# Patient Record
Sex: Female | Born: 1980 | Race: White | Hispanic: No | Marital: Married | Smoking: Never smoker
Health system: Southern US, Community
[De-identification: ages and names within clinical notes are randomized; demographics above are authoritative.]

## PROBLEM LIST (undated history)

## (undated) DIAGNOSIS — Z801 Family history of malignant neoplasm of trachea, bronchus and lung: Secondary | ICD-10-CM

## (undated) DIAGNOSIS — Z8042 Family history of malignant neoplasm of prostate: Secondary | ICD-10-CM

## (undated) DIAGNOSIS — Z8489 Family history of other specified conditions: Secondary | ICD-10-CM

## (undated) DIAGNOSIS — Z803 Family history of malignant neoplasm of breast: Secondary | ICD-10-CM

## (undated) HISTORY — DX: Family history of malignant neoplasm of prostate: Z80.42

## (undated) HISTORY — DX: Family history of malignant neoplasm of breast: Z80.3

## (undated) HISTORY — DX: Family history of other specified conditions: Z84.89

## (undated) HISTORY — DX: Family history of malignant neoplasm of trachea, bronchus and lung: Z80.1

## (undated) HISTORY — PX: TONSILLECTOMY: SUR1361

---

## 2018-11-23 ENCOUNTER — Telehealth: Payer: Self-pay | Admitting: Genetics

## 2018-11-23 NOTE — Telephone Encounter (Signed)
Touched base with patient regarding genetics apt Monday 1/113/2020- asked if she had copies of genetic testing for her/herfamily.   She states her mother tested positive for [facility] mutation and has copies of her test result and family history she can bring in with her to her appointment.

## 2018-11-26 ENCOUNTER — Inpatient Hospital Stay: Payer: Commercial Managed Care - PPO | Attending: Genetic Counselor | Admitting: Genetics

## 2018-11-26 DIAGNOSIS — Z8489 Family history of other specified conditions: Secondary | ICD-10-CM

## 2018-11-26 DIAGNOSIS — Z801 Family history of malignant neoplasm of trachea, bronchus and lung: Secondary | ICD-10-CM

## 2018-11-26 DIAGNOSIS — Z803 Family history of malignant neoplasm of breast: Secondary | ICD-10-CM

## 2018-11-26 DIAGNOSIS — Z8042 Family history of malignant neoplasm of prostate: Secondary | ICD-10-CM

## 2018-11-27 ENCOUNTER — Encounter: Payer: Self-pay | Admitting: Genetics

## 2018-11-27 DIAGNOSIS — Z801 Family history of malignant neoplasm of trachea, bronchus and lung: Secondary | ICD-10-CM | POA: Insufficient documentation

## 2018-11-27 DIAGNOSIS — Z803 Family history of malignant neoplasm of breast: Secondary | ICD-10-CM | POA: Insufficient documentation

## 2018-11-27 DIAGNOSIS — Z8042 Family history of malignant neoplasm of prostate: Secondary | ICD-10-CM | POA: Insufficient documentation

## 2018-11-27 DIAGNOSIS — Z8489 Family history of other specified conditions: Secondary | ICD-10-CM | POA: Insufficient documentation

## 2018-11-27 NOTE — Progress Notes (Signed)
REFERRING PROVIDER: Self/Pearson  PRIMARY PROVIDER:  Garlan Fillers, MD  PRIMARY REASON FOR VISIT:  1. Family history of familial paraganglioma   2. Family history of breast cancer   3. Family history of lung cancer   4. Family history of prostate cancer     HISTORY OF PRESENT ILLNESS:   Taylor Pearson, a 38 y.o. female, was seen for a [facility] cancer genetics consultation via self referral due to a family history of Hereditary Pheochromocytoma and Paraganglioma ([place] mutation).  Taylor Pearson presents to clinic today to discuss the possibility of a hereditary predisposition to cancer, genetic testing, and to further clarify her future cancer risks, as well as potential cancer risks for family members.   Taylor Pearson is a 38 y.o. female with no personal history of cancer.    HORMONAL RISK FACTORS:  Menarche was at age 58.  First live birth at age 74.  Ovaries intact: yes.  Hysterectomy: no.  Menopausal status: premenopausal.  Colonoscopy: no; not examined. Mammogram within the last year: no.  Past Medical History:  Diagnosis Date  . Family history of breast cancer   . Family history of familial paraganglioma   . Family history of lung cancer   . Family history of prostate cancer     Social History   Socioeconomic History  . Marital status: Married    Spouse name: Not on file  . Number of children: Not on file  . Years of education: Not on file  . Highest education level: Not on file  Occupational History  . Not on file  Social Needs  . Financial resource strain: Not on file  . Food insecurity:    Worry: Not on file    Inability: Not on file  . Transportation needs:    Medical: Not on file    Non-medical: Not on file  Tobacco Use  . Smoking status: Not on file  Substance and Sexual Activity  . Alcohol use: Not on file  . Drug use: Not on file  . Sexual activity: Not on file  Lifestyle  . Physical activity:    Days per week: Not on file    Minutes per session:  Not on file  . Stress: Not on file  Relationships  . Social connections:    Talks on phone: Not on file    Gets together: Not on file    Attends religious service: Not on file    Active member of club or organization: Not on file    Attends meetings of clubs or organizations: Not on file    Relationship status: Not on file  Other Topics Concern  . Not on file  Social History Narrative  . Not on file     FAMILY HISTORY:  We obtained a detailed, 4-generation family history.  Significant diagnoses are listed below: Family History  Problem Relation Age of Onset  . Other Pearson        Positive for [place] mutation   . Breast cancer Maternal Aunt 69       Genetic testing negative- VUS  . Lung cancer Paternal Uncle   . Prostate cancer Maternal Grandfather 68       also had bile duct or lung cancer?  . Bladder Cancer Maternal Grandfather   . Other Paternal Grandmother        might have had breast cancer >50  . Breast cancer Maternal Aunt 50       Genetic testing negative- VUS  .  Other Maternal Aunt        SDHC+  . Other Maternal Aunt        SDHC+  . Other Maternal Aunt        tumor removed from neck  . Other Cousin        tumors removed from abdomen  . Other Other 61       carotid paraganglioma, SDHC+    Taylor Pearson has 2 sons ages 24 months and 47 years.  Taylor Pearson has 2 brothers with no hx of cancer.   Taylor Pearson father: 80, no hx of cancer.   Paternal Aunts/Uncles: 2 paternal uncles- 63 recently died of lung cancer.   Paternal cousins: 2 double cousins (paternal uncle had children with maternal aunt). 1 of these cousins had had tumors removed from her abdomen.  Paternal grandfather: died in his 78's with no hx of cancer.  Paternal grandmother:died in her 42's, she maybe had breast cancer dx >50.   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  Maternal Aunts/Uncles: 5 maternal aunts, 1  maternal uncle.  -1 uncle is deceased, no hx of cancer.  -2 maternal aunts have no hx of cancer, but have also tested positive for the familial SDHC mutation.  -2 maternal aunts with breast cancer ages 16 and 27)- they had genetic testing reportedly for [facility] and hereditary breast cancer that was negative- but both had a VUS identified (gene unk).   Maternal cousins: 1 set of cousins are double cousins- this is the cousin who has had tumors removed from her abdomen.  Maternal grandfather: died at 33- hx of prostate and bladder cancer at 76, died from bile duct or lung cancer at 29.  He had a niece with breast cancer in his 11's.  This grandfather had a maternal aunt who had colon cancer I her 78's- this relative's son also had colon cancer in her 32's.  Maternal grandmother:deceased, no hx of cancer. Her brother (patient's great uncle) had a carotid paraganglioma dx at 64- he was the original family member to test positive for the [facility] mutation.  This grandmother's paternal aunt had breast cancer. Dx >50.    Patient's maternal ancestors are of [place] descent, and paternal ancestors are of Russian/Italian descent. There is no reported Ashkenazi Jewish ancestry. There is no known consanguinity.  Hereditary Pheochromocytoma and Paraganglioma:  Single pathogenic variants in the [facility] gene are associated with the development of paragangliomas (PGL) and pheochromocytomas [facility]) as seen in hereditary paraganglioma-pheochromocytoma syndrome ( 82423536, 14431540, 08676195, 09326712), gastrointestinal stromal tumors (GIST) ( 45809983, 38250539, 76734193), Carney-Stratakis syndrome ( 79024097, 35329924, 26834196, 22297989, 21194174, 08144818), and renal cell carcinoma (PMID: 56314970, 26378588, 50277412, 87867672). It is unclear if pathogenic SDHC variants are associated with other cancers as the data are currently limited and emerging. The clinical presentation is highly variable among those with a pathogenic variant  in [facility] and may be difficult to predict. An individual with a [place] pathogenic variant will not necessarily develop cancer in their lifetime, but the risk for cancer is increased over that of the general population.  These changes can be inherited, or they could occur as a de novo (new) pathogenic change.  Paraganglioma/Pheochromocytoma Paragangliomas (PGL) are rare, adult-onset, typically benign neuroendocrine tumors that arise from paraganglia. Paraganglia are a collection of neuroendocrine tissues that are distributed throughout the body, from the middle ear and skull base to the pelvis. PGLs that develop in  the head and neck are called head-and-neck paragangliomas (HNP). Head-and-neck paragangliomas are the most common tumor type seen in carriers of SDHC mutations specifically.   PGLs can be located outside the head and neck. When they occur in the adrenal glands they are called pheochromocytomas [facility]). PCCs are catecholamine-secreting PGLs that are confined to the adrenal medulla, as defined by the The World Health Organization Tumor Classification; however, this term may also be used to refer to catecholamine-producing PGLs regardless of whether they are adrenal or extra-adrenal (PMID: 61950932). These lesions can cause excessive production of adrenal hormones, resulting in hypertension, headaches, tachycardia, anxiety, and sweaty or clammy skin in some individuals (PMID: 67124580, 99833825, 05397673). Most cases of PGL and [place] are sporadic, but approximately one-third are familial and due to an identifiable pathogenic variant in a susceptibility gene, such as [place], that can result in hereditary paraganglioma-pheochromocytoma syndrome (PMID: 41937902, 40973532, 99242683).  Individuals with SDHC pathogenic variants are at risk to develop solitary, benign, parasympathetic HNPs, although adrenal [facility] and extra-adrenal PGL have also been reported (PMID: 41962229). Literature suggests the mean age of tumor  presentation is approximately 26 years (PMID: 79892119, 41740814, 48185631, 49702637) and malignant transformation is rare (PMID: 85885027, 74128786, 76720947). It has been suggested that [facility] pathogenic variants may be associated with pituitary adenomas and pancreatic neuroendocrine tumors, however, the evidence is preliminary and emerging (PMID: 09628366, 29476546, 50354656, 81275170). Because SDHC pathogenic variants are rare, data on the clinical characteristics associated with this gene are currently limited (PMID: 01749449).  GIST Gastrointestinal stromal tumors (GIST) are rare, typically adult-onset sarcomas that may be either benign or malignant. They can occur sporadically or due to a heritable pathogenic variant in a susceptibility gene, such as [facility] (PMID: 67591638). GIST can develop anywhere along the GI tract, which includes the esophagus, stomach, gallbladder, liver, small intestine, colon, rectum, anus, and lining of the gut. GISTs arise from a specific cell type, interstitial cells of [place] (ICC), which line the walls of the GI tract. More than half of GISTs start in the stomach. The next-largest proportion of cases start in the small intestine, followed by the omentum and peritoneum (PMID: 46659935, 70177939, 03009233).  Carney-Stratakis syndrome Carney-Stratakis syndrome (CSS) is an autosomal dominant condition that can be caused by pathogenic variants in [facility]. This rare condition is characterized by the development of PGL, GIST, or both (PMID: 00762263, 33545625, 63893734, 28768115, 72620355, 97416384). CSS is very similar to but distinctly different from [place], a typically sporadic condition associated with pulmonary chondromas, GIST, and PGL (PMID: 53646803, 21224825).  Renal Cell Carcinoma:It has been reported that mutations in SDHx genes are associated with an increased risk for renal cell carcinoma- this risk is still being studies and has not been well defined at this time.    Gene information [place] is a tumor-suppressor gene that is involved in complex II of the mitochondrial electron transport chain (UniProtKB - [id number] (C560_HUMAN); [email or web address]. Accessed September 2015). The succinate dehydrogenase enzyme, which is part of complex II, is composed of four subunit proteins encoded by SDHA, SDHB, SDHC, and SDHD. These are nuclear genes whose transcripts are then imported into the mitochondria, where they are modified, folded, and assembled (PMID: 00370488). Lack of any component will result in instability of the entire complex (PMID: 89169450). If there is a pathogenic variant in this gene that prevents it from functioning normally, the risk of developing certain types of cancers may be increased.  Inheritance Pathogenic variants in the [place] gene are inherited in an autosomal  dominant manner. This means that an individual with a pathogenic variant has a 50% chance of passing the condition on to their offspring. With this result, it is now possible to identify at-risk relatives who can pursue testing for this specific familial variant.   Many cases are inherited from a parent, but some cases can occur spontaneously (i.e., an individual with a pathogenic variant has parents who do not have it).  It is important that all of Taylor Pearson's maternal relatives (both men and women) know of the presence of this gene mutation. Site-specific genetic testing can sort out who in the family is at risk and who is not.   Taylor Pearson siblings have a 50% chance to have inherited this mutation. Her children would have the same risk should she test positive. We recommend they have genetic testing for this same mutation, as identifying the presence of this mutation would allow them to also take advantage of risk-reducing measures. Genetic testing for [facility] is advised in minors because screening typically begins in childhood.   Management It is suggested that individuals  with hereditary paraganglioma-pheochromocytoma syndrome, along with their at-risk relatives, have regular clinical monitoring by a physician or medical team with expertise in the treatment of hereditary GIST and PGL/PCC syndromes. A consultation with an endocrine surgeon, endocrinologist, and otolaryngologist is also recommended to establish an individualized care plan (PMID: 24235361).  Although there is currently no clear consensus regarding a screening and surveillance protocol for individuals with pathogenic SDHC variants, lifelong annual biochemical and clinical surveillance is suggested (PMID: 44315400, 86761950, 93267124, 58099833):  A proposed screening protocol is outlined in PMID: 82505397    Screening should begin between ages 45-8.     Blood pressure and exam at every medical visit. At minimum every 6-12 months  24-hour urinary excretion of fractionated metanephrines and catecholamines and/or plasma fractionated metanephrines at least annually to detect metastatic disease, tumor recurrence, or development of additional tumors.  Follow up with imaging by CT, MRI, 123I-MIBG (metaiodobenzylguanidine) scintigraphy, or FDG-PET if the fractionated metanephrine and/or catecholamine levels become elevated, or if the original tumor had minimal or no catecholamine/fractionated metanephrine excess  Whole-body MRI (skull base to pelvis) every 2 years  Consider/optional neck MRI + contrast every 2 yeares  Annual Plasma methoxytyramihne, consider also annual serum chromogranin A  Annual complete blood cound (w/RBC indices) to screen for GIST    Other methods of surveillance suggested for consideration include:  Periodic (e.g., every 1-4 years) 123I-MIBG scintigraphy or PET to detect paragangliomas or metastatic disease that may be undetectable by MRI or CT  Imaging modalities should be at the discretion of the managing provider due to conflicting data regarding the utility and efficacy of  the various options (PMID: 67341937, 90240973)  Evaluation of cases with extra-adrenal sympathetic PGL and [place] for blood pressure elevations, tachycardia, and other signs and symptoms of catecholamine hypersecretion  Consideration of evaluation for GISTs in individuals (especially children, adolescents, or young adults) who have unexplained gastrointestinal symptoms (e.g., abdominal pain, upper gastrointestinal bleeding, nausea, vomiting, difficulty swallowing) or who experience unexplained intestinal obstruction or anemia  Symptom awareness/education   Consideration of RCC sceening (particularly if family history of RCC). (PMID: 53299242)   It is important to note that individuals with a pathogenic SDHC variant may possibly be at a greater risk of developing PGL or [place] when living in higher altitudes or when chronically exposed to hypoxic conditions. In general, inactivating mutations in one of the succinate dehydrogenase genes, which includes SDHC,  leads to accumulation of succinate, the formation of reactive oxygen species, and the activation of hypoxia-dependent pathways (PMID: 77824235). Avoidance of living at high altitudes and activities that promote long-term exposure to hypoxia and predispose to chronic lung disease (e.g., smoking) is therefore encouraged (PMID: 36144315, 40086761).  The natural history of hereditary paraganglioma-pheochromocytoma syndrome is variable and continues to evolve. This can often result in significant uncertainty regarding long-term prognosis. In addition, the clinical manifestations of PGLs and PCCs are broad; many symptoms can mimic minor ailments such as headaches and palpitations. Because of this reason, once a pathogenic variant has been identified, patients should be encouraged to have a low threshold for contacting their healthcare provider for further evaluation of unusual symptoms (PMID: (external) 24854530":http://www.ncbi.nlm.nih.gov/pubmed/24854530).   Awareness of this cancer predisposition can encourage patients and their providers to inform at-risk family members, to diligently follow condition-specific screening protocols, and to be vigilant in maintaining close and regular contact with their local genetics clinic in anticipation of new information.  DISCUSSION: We discussed the options of family variant testing vs. Panel testing.  Taylor Pearson Pearson only had single gene testing, and therefore was not screened for breast, colon, other cancer mutations.  Her aunts with breast cancer have had genetic testing that did not identify a mutation.    We discussed benefits and limitations to panel testing.   Taylor Pearson declined panel testing at this time and elected to proceed with Family Variant testing/single site testing for her Pearson's [facility]  Mutation.   Based on the patient's personal and family history, the statistical model (Tyrer Cusik)   Was used to estimate her risk of developing breast cancer. This estimates her lifetime risk of developing breast cancer to to be approximately 18.3%-22.9% the variability of this range is due to the uncertainty regarding if her PGM had breast cancer or not.  The patient's lifetime breast cancer risk is a preliminary estimate based on available information using one of several models endorsed by the [place] (ACS). The ACS recommends consideration of breast MRI screening as an adjunct to mammography for patients at high risk (defined as 20% or greater lifetime risk). A more detailed breast cancer risk assessment can be considered, if clinically indicated.     Taylor Pearson may be considered high risk if her PGM truly did have breast cancer. In this case she could consider high risk cancer screening.  We recommend that Taylor Pearson should discuss her individual situation with her OBGYN and determine a breast cancer screening plan with which they are both comfortable.    PLAN: After considering the  risks, benefits, and limitations, Taylor Pearson  provided informed consent to pursue genetic testing and the saliva sample was sent to [facility] for analysis of the Family Variant Testing: [place]. Results should be available within approximately 2-3 weeks' time, at which point they will be disclosed by telephone to Taylor Pearson, as will any additional recommendations warranted by these results. Taylor Pearson will receive a summary of her genetic counseling visit and a copy of her results once available. This information will also be available in Epic. We encouraged Taylor Pearson to remain in contact with cancer genetics annually so that we can continuously update the family history and inform her of any changes in cancer genetics and testing that may be of benefit for her family. Taylor Pearson questions were answered to her satisfaction today. Our contact information was provided should additional questions or concerns arise.  Based on Taylor Pearson's family  history, we recommended her maternal relatives all have genetic counseling and testing. Taylor Pearson will let us know if we can be of any assistance in coordinating genetic counseling and/or testing for this family member.   Lastly, we encouraged Ms. Claflin to remain in contact with cancer genetics annually so that we can continuously update the family history and inform her of any changes in cancer genetics and testing that may be of benefit for this family.   Ms.  Biskup questions were answered to her satisfaction today. Our contact information was provided should additional questions or concerns arise. Thank you for the referral and allowing [place] to share in the care of your patient.   Tana Felts, MS, [facility] Certified Genetic Counselor [email or web address] phone: [phone]  The patient was seen for a total of 40 minutes in face-to-face genetic counseling.  This patient was discussed with Drs. Magrinat, Lindi Adie and/or Burr Medico who agrees with the  above.

## 2018-12-11 ENCOUNTER — Telehealth: Payer: Self-pay | Admitting: Genetics

## 2018-12-11 NOTE — Telephone Encounter (Signed)
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    We discussed next steps for her and her family.   For [name], we recommended referral to endocrinology to discuss and implement a screening plan.  We suggested Dr. Buddy Duty who has seen several of our SDH- families in the past.  She was agreeable to this and we will coordinate this referral.   She declined a follow-up appointment with genetics- this was a known familial variant so the result was well covered during her pre-test counseling.   We discussed her tyrer cusik breast cancer risk estimate was slightly over 20% if her paternal grandmother did indeed have breast cancer, (just under if she did not)- the patient was unsure weather she had breast cancer or not.  We will send her Tyrer Cusick score to her OBGYN for follow-up discussion regarding breast screening.   We also recommended [name] inform her doctors if she is planning to have more children in the future, as they may want to do some extra screening before pregnancy to rule out pheochromocytoma/paraganglioma.   For her Family:  We recommended her siblings and all maternal relatives also have genetic testing for the [facility] mutation.  We discussed her children each have a 50% chance to also carry this mutation.  While it is unlikely to affect children, SDH-related tumors have been reported in children, therefore current screening protocol suggestions recommended screening start around the ages of 6-8 years. Therefore, it would be appropriate for her older son to be tested at this time (he is 44).  [name] was interested in getting genetic testing for her son.  We will have her fill out and sigh a release form and we can send her records to the [place] pediatric clinic who can test her son. (Our cancer center does not have pediatricians and cannot test children).   She will let us know if she has  any further questions or concerns.

## 2018-12-19 ENCOUNTER — Telehealth: Payer: Self-pay | Admitting: Genetics

## 2018-12-19 NOTE — Telephone Encounter (Signed)
Left message informing patient that I was able to contact [facility] to make referral for management of her [facility] mutation as requested (rather than needing to have primary care make referral).   They should be reaching out to her to set up appointment.  Left my contact info if she has further questions.

## 2018-12-27 ENCOUNTER — Ambulatory Visit: Payer: Self-pay | Admitting: Genetics

## 2018-12-27 ENCOUNTER — Encounter: Payer: Self-pay | Admitting: Genetics

## 2018-12-27 DIAGNOSIS — Q998 Other specified chromosome abnormalities: Secondary | ICD-10-CM | POA: Insufficient documentation

## 2018-12-27 DIAGNOSIS — Z8042 Family history of malignant neoplasm of prostate: Secondary | ICD-10-CM

## 2018-12-27 DIAGNOSIS — Z8489 Family history of other specified conditions: Secondary | ICD-10-CM

## 2018-12-27 DIAGNOSIS — Z1509 Genetic susceptibility to other malignant neoplasm: Secondary | ICD-10-CM

## 2018-12-27 DIAGNOSIS — Z801 Family history of malignant neoplasm of trachea, bronchus and lung: Secondary | ICD-10-CM

## 2018-12-27 DIAGNOSIS — Z803 Family history of malignant neoplasm of breast: Secondary | ICD-10-CM

## 2018-12-27 NOTE — Progress Notes (Signed)
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  Please refer to our prior cancer genetics clinic note for more information regarding Taylor Pearson's medical, social and family histories, and our assessment and recommendations, at the time. Taylor Pearson recent genetic test results were disclosed to her, as well as recommendations warranted by these results. These results and recommendations are discussed in more detail below.  CANCER HISTORY:  No personal history of cancer.     FAMILY HISTORY:  We obtained a detailed, 4-generation family history.  Significant diagnoses are listed below: Family History  Problem Relation Age of Onset  . Other Mother        Positive for [place] mutation   . Breast cancer Maternal Aunt 58       Genetic testing negative- VUS  . Lung cancer Paternal Uncle   . Prostate cancer Maternal Grandfather 60       also had bile duct or lung cancer?  . Bladder Cancer Maternal Grandfather   . Other Paternal Grandmother        might have had breast cancer >50  . Breast cancer Maternal Aunt 50       Genetic testing negative- VUS  . Other Maternal Aunt        SDHC+  . Other Maternal Aunt        SDHC+  . Other Maternal Aunt        tumor removed from neck  . Other Cousin        tumors removed from abdomen  . Other Other 61       carotid paraganglioma, SDHC+    Taylor Pearson has 2 sons ages 16 months and 46 years.  Taylor Pearson has 2 brothers with no hx of cancer.   Taylor Pearson father: 66, no hx of cancer.   Paternal Aunts/Uncles: 2 paternal uncles- 74 recently died of lung cancer.   Paternal cousins: 2 double cousins (paternal uncle had children with maternal aunt). 1 of these cousins had had tumors removed from her abdomen.  Paternal grandfather: died in his 26's with no hx of cancer.  Paternal grandmother:died in her 50's, she maybe had  breast cancer dx >50.   Taylor Pearson's mother: recently had genteic testing and was positive for a SDHC Likely Pathogneic mutation B.151+7O>H (splice donor).  Maternal Aunts/Uncles: 5 maternal aunts, 1 maternal uncle.  -1 uncle is deceased, no hx of cancer.  -2 maternal aunts have no hx of cancer, but have also tested positive for the familial SDHC mutation.  -2 maternal aunts with breast cancer ages 36 and 71)- they had genetic testing reportedly for [facility] and hereditary breast cancer that was negative- but both had a VUS identified (gene unk).   Maternal cousins: 1 set of cousins are double cousins- this is the cousin who has had tumors removed from her abdomen.  Maternal grandfather: died at 51- hx of prostate and bladder cancer at 19, died from bile duct or lung cancer at 19.  He had a niece with breast cancer in his 39's.  This grandfather had a maternal aunt who had colon cancer I her 34's- this relative's son also had colon cancer in her 87's.  Maternal grandmother:deceased, no hx of cancer. Her brother (patient's great uncle) had a carotid paraganglioma dx at 60- he was the original family member to test positive for the [facility] mutation.  This grandmother's paternal aunt had breast cancer. Dx >50.    Patient's maternal ancestors are of [place] descent, and paternal ancestors are of Russian/Italian descent. There is no reported Ashkenazi Jewish ancestry. There is no known consanguinity.  GENETIC TEST RESULTS: Genetic testing was performed through [facility] Family Variant Testing (single site testing) program and reported out on 12/06/2018.   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    The test report will be scanned into EPIC and will be located under the Molecular Pathology section of the Results Review tab. A portion of the result report is included below for reference.     We discussed with Taylor Pearson that because current genetic  testing is not perfect, it is possible there may be a gene mutation in one of these genes that current testing cannot detect, but that chance is small.  We also discussed, that there could be another gene that has not yet been discovered, or that we have not yet tested, that is responsible for the cancer diagnoses in the family. It is also possible there is a hereditary cause for the cancer in the family that Taylor Pearson did not inherit and therefore was not identified in her testing.  Therefore, it is important to remain in touch with cancer genetics in the future so that we can continue to offer Ms. Oguinn the most up to date genetic testing.   Hereditary Pheochromocytoma and Paraganglioma:  Single pathogenic variants in the [employer] gene are associated with the development of paragangliomas (PGL) and pheochromocytomas [facility]) as seen in hereditary paraganglioma-pheochromocytoma syndrome ( 99833825, 05397673, 41937902, 40973532), gastrointestinal stromal tumors (GIST) ( 99242683, 41962229, 79892119), Carney-Stratakis syndrome ( 41740814, 48185631, 49702637, 85885027, 74128786, 76720947), and renal cell carcinoma (PMID: 09628366, 29476546, 50354656, 81275170). It is unclear if pathogenic SDHC variants are associated with other cancers as the data are currently limited and emerging. The clinical presentation is highly variable among those with a pathogenic variant in [facility] and may be difficult to predict. An individual with a [place] pathogenic variant will not necessarily develop cancer in their lifetime, but the risk for cancer is increased over that of the general population.  These changes can be inherited, or they could occur as a de novo (new) pathogenic change.  Paraganglioma/Pheochromocytoma Paragangliomas (PGL) are rare, adult-onset, typically benign neuroendocrine tumors that arise from paraganglia. Paraganglia are a collection of neuroendocrine tissues that are distributed throughout the body, from the middle  ear and skull base to the pelvis. PGLs that develop in the head and neck are called head-and-neck paragangliomas (HNP). Head-and-neck paragangliomas are the most common tumor type seen in carriers of SDHC mutations specifically.   PGLs can be located outside the head and neck. When they occur in the adrenal glands they are called pheochromocytomas [facility]). PCCs are catecholamine-secreting PGLs that are confined to the adrenal medulla, as defined by the The World Health Organization Tumor Classification; however, this term may also be used to refer to catecholamine-producing PGLs regardless of whether they are adrenal or extra-adrenal (PMID: 01749449). These lesions can cause excessive production of adrenal hormones, resulting in hypertension, headaches, tachycardia, anxiety, and sweaty or clammy skin in some individuals (PMID: 67591638, 46659935, 70177939). Most cases of PGL and [place] are sporadic, but approximately one-third are familial and due to an identifiable pathogenic variant in a susceptibility gene, such as [place], that can result in hereditary paraganglioma-pheochromocytoma syndrome (PMID: 03009233, 00762263, 33545625).  Individuals with SDHC pathogenic variants are at  risk to develop solitary, benign, parasympathetic HNPs, although adrenal [facility] and extra-adrenal PGL have also been reported (PMID: 28413244). Literature suggests the mean age of tumor presentation is approximately 3 years (PMID: 01027253, 66440347, 42595638, 75643329) and malignant transformation is rare (PMID: 51884166, 06301601, 09323557). It has been suggested that [facility] pathogenic variants may be associated with pituitary adenomas and pancreatic neuroendocrine tumors, however, the evidence is preliminary and emerging (PMID: 32202542, 70623762, 83151761, 60737106). Because SDHC pathogenic variants are rare, data on the clinical characteristics associated with this gene are currently limited (PMID: 26948546).  GIST Gastrointestinal  stromal tumors (GIST) are rare, typically adult-onset sarcomas that may be either benign or malignant. They can occur sporadically or due to a heritable pathogenic variant in a susceptibility gene, such as [facility] (PMID: 27035009). GIST can develop anywhere along the GI tract, which includes the esophagus, stomach, gallbladder, liver, small intestine, colon, rectum, anus, and lining of the gut. GISTs arise from a specific cell type, interstitial cells of [place] (ICC), which line the walls of the GI tract. More than half of GISTs start in the stomach. The next-largest proportion of cases start in the small intestine, followed by the omentum and peritoneum (PMID: 38182993, 71696789, 38101751).  Carney-Stratakis syndrome Carney-Stratakis syndrome (CSS) is an autosomal dominant condition that can be caused by pathogenic variants in [facility]. This rare condition is characterized by the development of PGL, GIST, or both (PMID: 02585277, 82423536, 14431540, 08676195, 09326712, 45809983). CSS is very similar to but distinctly different from [place] triad, a typically sporadic condition associated with pulmonary chondromas, GIST, and PGL (PMID: 38250539, 76734193).  Renal Cell Carcinoma:It has been reported that mutations in SDHx genes are associated with an increased risk for renal cell carcinoma- this risk is still being studies and has not been well defined at this time.   Gene information [place] is a tumor-suppressor gene that is involved in complex II of the mitochondrial electron transport chain (UniProtKB - [id number] (C560_HUMAN); [email or web address]. Accessed September 2015). The succinate dehydrogenase enzyme, which is part of complex II, is composed of four subunit proteins encoded by SDHA, SDHB, SDHC, and SDHD. These are nuclear genes whose transcripts are then imported into the mitochondria, where they are modified, folded, and assembled (PMID: 79024097). Lack of any component will result in  instability of the entire complex (PMID: 35329924). If there is a pathogenic variant in this gene that prevents it from functioning normally, the risk of developing certain types of cancers may be increased.  Inheritance Pathogenic variants in the [facility] gene are inherited in an autosomal dominant manner. This means that an individual with a pathogenic variant has a 50% chance of passing the condition on to their offspring. With this result, it is now possible to identify at-risk relatives who can pursue testing for this specific familial variant.   Many cases are inherited from a parent, but some cases can occur spontaneously (i.e., an individual with a pathogenic variant has parents who do not have it).  It is important that all of Taylor Pearson's maternal relatives (both men and women) know of the presence of this gene mutation. Site-specific genetic testing can sort out who in the family is at risk and who is not.   Taylor Pearson children and siblings have a 50% chance to have inherited this mutation. We recommend they have genetic testing for this same mutation, as identifying the presence of this mutation would allow them to also take advantage of risk-reducing measures. Genetic testing for [facility] is advised in minors  because screening typically begins in childhood.   Management It is suggested that individuals with hereditary paraganglioma-pheochromocytoma syndrome, along with their at-risk relatives, have regular clinical monitoring by a physician or medical team with expertise in the treatment of hereditary GIST and PGL/PCC syndromes. A consultation with an endocrine surgeon, endocrinologist, and otolaryngologist is also recommended to establish an individualized care plan (PMID: 71245809).  Although there is currently no clear consensus regarding a screening and surveillance protocol for individuals with pathogenic SDHC variants, lifelong annual biochemical and clinical surveillance is suggested  (PMID: 98338250, 53976734, 19379024, 09735329):  A proposed screening protocol is outlined in PMID: 92426834    Screening should begin between ages 66-8.     Blood pressure and exam at every medical visit. At minimum every 6-12 months  24-hour urinary excretion of fractionated metanephrines and catecholamines and/or plasma fractionated metanephrines at least annually to detect metastatic disease, tumor recurrence, or development of additional tumors. ? Follow up with imaging by CT, MRI, 123I-MIBG (metaiodobenzylguanidine) scintigraphy, or FDG-PET if the fractionated metanephrine and/or catecholamine levels become elevated, or if the original tumor had minimal or no catecholamine/fractionated metanephrine excess  Whole-body MRI (skull base to pelvis) every 2 years  Consider/optional neck MRI + contrast every 2 yeares  Annual Plasma methoxytyramihne, consider also annual serum chromogranin A  Annual complete blood cound (w/RBC indices) to screen for GIST  Other methods of surveillance suggested for consideration include:  Periodic (e.g., every 1-4 years) 123I-MIBG scintigraphy or PET to detect paragangliomas or metastatic disease that may be undetectable by MRI or CT ? Imaging modalities should be at the discretion of the managing provider due to conflicting data regarding the utility and efficacy of the various options (PMID: 19622297, 98921194)  Evaluation of cases with extra-adrenal sympathetic PGL and [place] for blood pressure elevations, tachycardia, and other signs and symptoms of catecholamine hypersecretion  Consideration of evaluation for GISTs in individuals (especially children, adolescents, or young adults) who have unexplained gastrointestinal symptoms (e.g., abdominal pain, upper gastrointestinal bleeding, nausea, vomiting, difficulty swallowing) or who experience unexplained intestinal obstruction or anemia  Symptom awareness/education   Consideration of RCC sceening  (particularly if family history of RCC). (PMID: 17408144)   It is important to note that individuals with a pathogenic SDHC variant may possibly be at a greater risk of developing PGL or [place] when living in higher altitudes or when chronically exposed to hypoxic conditions. In general, inactivating mutations in one of the succinate dehydrogenase genes, which includes SDHC, leads to accumulation of succinate, the formation of reactive oxygen species, and the activation of hypoxia-dependent pathways (PMID: 81856314). Avoidance of living at high altitudes and activities that promote long-term exposure to hypoxia and predispose to chronic lung disease (e.g., smoking) is therefore encouraged (PMID: 97026378, 58850277).  The natural history of hereditary paraganglioma-pheochromocytoma syndrome is variable and continues to evolve. This can often result in significant uncertainty regarding long-term prognosis. In addition, the clinical manifestations of PGLs and PCCs are broad; many symptoms can mimic minor ailments such as headaches and palpitations. Because of this reason, once a pathogenic variant has been identified, patients should be encouraged to have a low threshold for contacting their healthcare provider for further evaluation of unusual symptoms (PMID: (external) 24854530":http://www.ncbi.nlm.nih.gov/pubmed/24854530).  Awareness of this cancer predisposition can encourage patients and their providers to inform at-risk family members, to diligently follow condition-specific screening protocols, and to be vigilant in maintaining close and regular contact with their local genetics clinic in anticipation of new information.  Taylor Pearson declined  panel testing at this time and elected to proceed with Family Variant testing/single site testing for her mother's [facility]  Mutation.   Based on the patient's personal and family history, the statistical model (Tyrer Cusik)   Was used to estimate her risk of  developing breast cancer. This estimates her lifetime risk of developing breast cancer to to be approximately 18.3%-22.9% the variability of this range is due to the uncertainty regarding if her PGM had breast cancer or not.  The patient's lifetime breast cancer risk is a preliminary estimate based on available information using one of several models endorsed by the [place] (ACS). The ACS recommends consideration of breast MRI screening as an adjunct to mammography for patients at high risk (defined as 20% or greater lifetime risk). A more detailed breast cancer risk assessment can be considered, if clinically indicated.     Taylor Pearson    PLAN:   1. We have recommended Taylor Pearson establish care with an endocrinologist to establish and follow a screening plan.    Taylor Pearson is agreeable and has requested we make the referral if possible.  We have contacted and faxed her information and referral to [facility] endocrinology. They should be reaching out to schedule this appointment.   2. We have faxed her results to [place] where they are able to test minors and requested they contact Taylor Pearson to schedule a appointment for her son to be tested.   3. Taylor Pearson did not have any other questions and felt comfortable with this result after our pretest counseling session and phone discussion she declined a follow-up appointment with genetic counseling at this time.   Lastly, we discussed with Taylor Pearson that cancer genetics is a rapidly advancing field and it is possible that new genetic tests will be appropriate for her and/or her family members in the future. We encouraged her to remain in contact with cancer genetics on an annual basis so we can update her personal and family histories and let her know of advances in cancer genetics that may benefit this family.   Our contact number was provided. Ms. Irigoyen questions were answered to her satisfaction, and  she knows she is welcome to call us at anytime with additional questions or concerns.   Ferol Luz, MS, [facility] Certified Genetic Counselor [email or web address]

## 2019-01-22 ENCOUNTER — Encounter: Payer: Self-pay | Admitting: Internal Medicine

## 2019-01-22 ENCOUNTER — Ambulatory Visit (INDEPENDENT_AMBULATORY_CARE_PROVIDER_SITE_OTHER): Payer: Commercial Managed Care - PPO | Admitting: Internal Medicine

## 2019-01-22 VITALS — BP 90/60 | HR 60 | Ht 68.0 in | Wt 157.0 lb

## 2019-01-22 DIAGNOSIS — D447 Neoplasm of uncertain behavior of aortic body and other paraganglia: Secondary | ICD-10-CM

## 2019-01-22 LAB — CBC WITH DIFFERENTIAL/PLATELET
Basophils Absolute: 0.1 10*3/uL (ref 0.0–0.1)
Basophils Relative: 0.8 % (ref 0.0–3.0)
Eosinophils Absolute: 0.2 10*3/uL (ref 0.0–0.7)
Eosinophils Relative: 2.2 % (ref 0.0–5.0)
HCT: 40.2 % (ref 36.0–46.0)
Hemoglobin: 13.8 g/dL (ref 12.0–15.0)
Lymphocytes Relative: 38.8 % (ref 12.0–46.0)
Lymphs Abs: 3.2 10*3/uL (ref 0.7–4.0)
MCHC: 34.3 g/dL (ref 30.0–36.0)
MCV: 84.6 fl (ref 78.0–100.0)
Monocytes Absolute: 0.7 10*3/uL (ref 0.1–1.0)
Monocytes Relative: 8 % (ref 3.0–12.0)
Neutro Abs: 4.2 10*3/uL (ref 1.4–7.7)
Neutrophils Relative %: 50.2 % (ref 43.0–77.0)
Platelets: 230 10*3/uL (ref 150.0–400.0)
RBC: 4.75 Mil/uL (ref 3.87–5.11)
RDW: 12.9 % (ref 11.5–15.5)
WBC: 8.3 10*3/uL (ref 4.0–10.5)

## 2019-01-22 NOTE — Progress Notes (Signed)
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 is a 38 y.o. female with unremarkable past medical history . The patient presented for initial endocrinology clinic visit on 01/23/2019 for consultative assistance with her [facility] mutation.   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  She is here for further work up paragangliomas an pheochromocytoma.   Headaches: rare HTN:no Palpitations: no Anxiety: situational Sweaty, Clammy sensations:  No  She also denies any abdominal pain, nausea,, or any change in bowel movements, she does have chronic constipation. She has had fissures in her 99's.  No scope done at the time. 2 brothers  Has 2 sons 2 and 6 yrs.   School administrator   HISTORY:  Past Medical History:  Past Medical History:  Diagnosis Date  . Family history of breast cancer   . Family history of familial paraganglioma   . Family history of lung cancer   . Family history of prostate cancer    Past Surgical History:  Past Surgical History:  Procedure Laterality Date  . TONSILLECTOMY        Social History:    Family History: family history includes Bladder Cancer in her maternal grandfather; Breast cancer (age of onset: 59) in her maternal aunt; Breast cancer (age of onset: 67) in her maternal aunt; Lung cancer in her paternal uncle; Other in her cousin, maternal aunt, maternal aunt, maternal aunt, mother, and paternal grandmother; Other (age of onset: 10) in an other family member; Prostate cancer (age of onset: 65) in her maternal grandfather.   HOME MEDICATIONS: Allergies as of 01/22/2019      Reactions   Augmentin [amoxicillin-pot Clavulanate]    Rash       Medication List    as of January 22, 2019 11:59 PM   You have not been prescribed any medications.       REVIEW OF SYSTEMS: A comprehensive ROS was conducted with the patient and is negative except as per HPI and below:  Review of Systems  Constitutional: Negative for fever and weight loss.  HENT: Negative for congestion and sore throat.   Eyes: Negative for blurred vision and discharge.  Respiratory: Negative for cough and shortness of breath.   Cardiovascular: Negative for chest pain and palpitations.  Gastrointestinal: Negative for abdominal pain and nausea.  Genitourinary: Negative for frequency.  Neurological: Negative for tingling, tremors and headaches.  Endo/Heme/Allergies: Negative for polydipsia.  Psychiatric/Behavioral: Negative for depression. The patient is not nervous/anxious.        OBJECTIVE:  VS: BP 90/60   Pulse 60   Ht 5\' 8"  (1.727 m)   Wt 157 lb (71.2 kg)   BMI 23.87 kg/m    Wt Readings from Last 3 Encounters:  01/22/19 157 lb (71.2 kg)     EXAM: General: Pt appears well and is in NAD  Hydration: Well-hydrated with moist mucous membranes and good skin turgor  Eyes: External eye exam normal without stare, lid lag or exophthalmos.  EOM intact.  PERRL.  Ears, Nose, Throat: Hearing: Grossly intact bilaterally Dental: Good dentition  Throat: Clear without mass, erythema or exudate  Neck: General: Supple without adenopathy.  Thyroid: Thyroid size normal.  No goiter or nodules appreciated. No thyroid bruit.  Lungs: Clear with good BS bilat with no rales, rhonchi, or wheezes  Heart: Auscultation: RRR.  Abdomen: Normoactive bowel sounds, soft, nontender, without masses or organomegaly palpable  Extremities: Gait and station: Normal gait  Digits and nails: No clubbing, cyanosis, petechiae, or nodes Head and neck: Normal alignment and mobility BL UE: Normal ROM and strength. BL LE: No pretibial edema normal ROM and strength.  Skin: Hair:  Texture and amount normal with gender appropriate distribution Skin Inspection: No rashes, acanthosis nigricans/skin tags. No lipohypertrophy Skin Palpation: Skin temperature, texture, and thickness normal to palpation  Neuro: Cranial nerves: II - XII grossly intact  Cerebellar: Normal coordination and movement; no tremor Motor: Normal strength throughout DTRs: 2+ and symmetric in UE without delay in relaxation phase  Mental Status: Judgment, insight: Intact Orientation: Oriented to time, place, and person Memory: Intact for recent and remote events Mood and affect: No depression, anxiety, or agitation     DATA REVIEWED:   Results for Taylor Pearson (MRN [id number]) as of 01/23/2019 07:42  Ref. Range 01/22/2019 15:03  WBC Latest Ref Range: 4.0 - 10.5 K/uL 8.3  RBC Latest Ref Range: 3.87 - 5.11 Mil/uL 4.75  Hemoglobin Latest Ref Range: 12.0 - 15.0 g/dL 13.8  HCT Latest Ref Range: 36.0 - 46.0 % 40.2  MCV Latest Ref Range: 78.0 - 100.0 fl 84.6  MCHC Latest Ref Range: 30.0 - 36.0 g/dL 34.3  RDW Latest Ref Range: 11.5 - 15.5 % 12.9  Platelets Latest Ref Range: 150.0 - 400.0 K/uL 230.0  Neutrophils Latest Ref Range: 43.0 - 77.0 % 50.2  Lymphocytes Latest Ref Range: 12.0 - 46.0 % 38.8  Monocytes Relative Latest Ref Range: 3.0 - 12.0 % 8.0  Eosinophil Latest Ref Range: 0.0 - 5.0 % 2.2  Basophil Latest Ref Range: 0.0 - 3.0 % 0.8  NEUT# Latest Ref Range: 1.4 - 7.7 K/uL 4.2  Lymphocyte # Latest Ref Range: 0.7 - 4.0 K/uL 3.2  Monocyte # Latest Ref Range: 0.1 - 1.0 K/uL 0.7  Eosinophils Absolute Latest Ref Range: 0.0 - 0.7 K/uL 0.2  Basophils Absolute Latest Ref Range: 0.0 - 0.1 K/uL 0.1   Plasma metanephrines-pending   Old records , labs and images have been reviewed.   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 -We have discussed that we will obtain plasma fractionated metanephrines, this will need to be done manually. -We will order whole-body MRI to include the skull base to the pelvis, and this will be done every 2 years. -We will obtain CBC with differential, to screen for GIST -We will consider 123 I-MIBG scintigraphy or an FDG-PET at some point as well, and this may be done every 5 years.   Follow-up 1 year  25 minutes were spent with the patient, over 50% of the time was spent in counseling and education.  Signed electronically by: Mack Guise, MD  [facility] Endocrinology  [facility] [address] [place], [place] [address] Phone: [phone] FAX: [phone]   CC: Taylor Pearson, [place] [address] Phone: [phone] Fax: [phone]   Return to Endocrinology clinic as below: Future Appointments  Date Time Provider [place]  01/22/2020  3:40 PM Burr Soffer, Melanie Crazier, MD [facility] None

## 2019-01-22 NOTE — Patient Instructions (Signed)
-   Please stop by the lab today, we will contact you with the results.  - We also ordered MRI , if you do not hear from anyone about this appointment please contact our office.

## 2019-01-24 ENCOUNTER — Encounter: Payer: Self-pay | Admitting: Internal Medicine

## 2019-01-25 ENCOUNTER — Encounter: Payer: Self-pay | Admitting: Internal Medicine

## 2019-01-25 LAB — METANEPHRINES, PLASMA
Metanephrine, Free: 35 pg/mL (ref <=57)
Normetanephrine, Free: 97 pg/mL (ref <=148)
Total Metanephrines-Plasma: 132 pg/mL (ref <=205)

## 2020-01-20 ENCOUNTER — Other Ambulatory Visit: Payer: Self-pay

## 2020-01-22 ENCOUNTER — Other Ambulatory Visit: Payer: Self-pay

## 2020-01-22 ENCOUNTER — Encounter: Payer: Self-pay | Admitting: Internal Medicine

## 2020-01-22 ENCOUNTER — Ambulatory Visit (INDEPENDENT_AMBULATORY_CARE_PROVIDER_SITE_OTHER): Payer: Commercial Managed Care - PPO | Admitting: Internal Medicine

## 2020-01-22 VITALS — BP 98/62 | HR 90 | Temp 98.4°F | Ht 68.0 in | Wt 155.2 lb

## 2020-01-22 DIAGNOSIS — D447 Neoplasm of uncertain behavior of aortic body and other paraganglia: Secondary | ICD-10-CM

## 2020-01-22 NOTE — Patient Instructions (Signed)

## 2020-01-22 NOTE — Progress Notes (Signed)
Name: Taylor Pearson  MRN/ DOB: [id number], 1981/02/01    Age/ Sex: 39 y.o., female     PCP: Taylor Fillers, MD   Reason for Endocrinology Evaluation: Taylor Pearson mutation     Initial Endocrinology Clinic Visit: 01/23/2019    PATIENT IDENTIFIER: Taylor Pearson is a 39 y.o., female with unremarkable past medical history. She has followed with [place] [facility] since 01/23/2019 for consultative assistance with management of her [facility] mutation.  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     Kids 3 and 62 yrs old  [place] maternal uncle with paraganglioma  SUBJECTIVE:   During last visit (01/23/2019): Negative biochemical testing   Today (01/23/2020):  Taylor Pearson is here for [facility] Pearson mutation.  Headaches: no HTN: no Palpitations: no Anxiety: no  Sweaty, Clammy sensations:  no  Abdominal Pain: no    ROS:  As per HPI.   HISTORY:  Past Medical History:  Past Medical History:  Diagnosis Date  . Family history of breast cancer   . Family history of familial paraganglioma   . Family history of lung cancer   . Family history of prostate cancer    Past Surgical History:  Past Surgical History:  Procedure Laterality Date  . TONSILLECTOMY      Social History:  reports that she has never smoked. She has never used smokeless tobacco. She reports previous alcohol use. She reports that she does not use drugs. Family History:  Family History  Problem Relation Age of Onset  . Other Mother        Positive for [place] mutation   . Breast cancer Maternal Aunt 1       Genetic testing negative- VUS  . Lung cancer Paternal Uncle   . Prostate cancer Maternal Grandfather 53       also had bile duct or lung cancer?  . Bladder Cancer Maternal Grandfather   . Other Paternal Grandmother        might have had breast cancer >50  . Breast cancer Maternal Aunt 50        Genetic testing negative- VUS  . Other Maternal Aunt        SDHC+  . Other Maternal Aunt        SDHC+  . Other Maternal Aunt        tumor removed from neck  . Other Cousin        tumors removed from abdomen  . Other Other 61       carotid paraganglioma, [facility]     HOME MEDICATIONS: Allergies as of 01/22/2020      Reactions   Augmentin [amoxicillin-pot Clavulanate]    Rash       Medication List       Accurate as of January 22, 2020 11:59 PM. If you have any questions, ask your nurse or doctor.        multivitamin capsule Take by mouth.         OBJECTIVE:   PHYSICAL EXAM: VS: BP 98/62 (BP Location: Right Arm, Patient Position: Sitting, Cuff Size: Large)   Pulse 90   Temp 98.4 F (36.9 C)   Ht 5\' 8"  (1.727 m)   Wt 155 lb 3.2 oz (70.4 kg)   LMP 12/16/2019 (Approximate)   SpO2 99%   BMI 23.60 kg/m    EXAM: General: Pt appears well and is in NAD  Neck:  General: Supple without adenopathy. Thyroid: Thyroid size normal.  No goiter or nodules appreciated. No thyroid bruit.  Lungs: Clear with good BS bilat with no rales, rhonchi, or wheezes  Heart: Auscultation: RRR.  Abdomen: Normoactive bowel sounds, soft, nontender, without masses or organomegaly palpable  Extremities:  BL LE: No pretibial edema normal ROM and strength.  Skin: Hair: Texture and amount normal with gender appropriate distribution Skin Inspection: No rashes Skin Palpation: Skin temperature, texture, and thickness normal to palpation  Mental Status: Judgment, insight: Intact Orientation: Oriented to time, place, and person Mood and affect: No depression, anxiety, or agitation     DATA REVIEWED: Results for Taylor Pearson (MRN [id number]) as of 01/23/2020 20:52  Ref. Range 01/22/2020 16:32  Sodium Latest Ref Range: 135 - 145 mEq/L 136  Potassium Latest Ref Range: 3.5 - 5.1 mEq/L 3.8  Chloride Latest Ref Range: 96 - 112 mEq/L 104  CO2 Latest Ref Range: 19 - 32 mEq/L 27  Glucose Latest Ref  Range: 70 - 99 mg/dL 103 (H)  BUN Latest Ref Range: 6 - 23 mg/dL 12  Creatinine Latest Ref Range: 0.40 - 1.20 mg/dL 0.66  Calcium Latest Ref Range: 8.4 - 10.5 mg/dL 9.1  Alkaline Phosphatase Latest Ref Range: 39 - 117 U/L 42  Albumin Latest Ref Range: 3.5 - 5.2 g/dL 4.2  AST Latest Ref Range: 0 - 37 U/L 24  ALT Latest Ref Range: 0 - 35 U/L 26  Total Protein Latest Ref Range: 6.0 - 8.3 g/dL 7.2  Total Bilirubin Latest Ref Range: 0.2 - 1.2 mg/dL 0.4  GFR Latest Ref Range: >60.00 mL/min 100.22      ASSESSMENT / PLAN / RECOMMENDATIONS:   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 - SDHC mutations is an autosomal dominant with variable penetrance -There is no evidence of clinical catecholamine excess at this time, we will proceed with 24-hour urine collection of catecholamines and metanephrines. -I have ordered a whole-body MRI last year but due to Covid-19 she was uncomfortable proceeding with this at the time, patient in agreement to proceed with MRI imaging, these will be considered every 2 years.  We did discuss that in hereditary paragangliomas, these tumors tend to grow prior to becoming excretary and it may be beneficial for the patient to diagnose these early on.  Patient expressed understanding -We will consider 123 I-MIBG scintigraphy or an FDG-PET at some next year, and this may be done every 5 years     Signed electronically by: Taylor Guise, MD  [facility] Endocrinology  [address] [place], [place] [address] Phone: [phone] FAX: [phone]      CC: Taylor Pearson, [place] [address] Phone: [phone]  Fax: [phone]   Return to Endocrinology clinic as below: Future Appointments  Date Time Provider [place]  01/27/2021  3:40 PM Reyce Lubeck, Melanie Crazier, MD  [facility] None

## 2020-01-23 ENCOUNTER — Encounter: Payer: Self-pay | Admitting: Internal Medicine

## 2020-01-23 LAB — COMPREHENSIVE METABOLIC PANEL
ALT: 26 U/L (ref 0–35)
AST: 24 U/L (ref 0–37)
Albumin: 4.2 g/dL (ref 3.5–5.2)
Alkaline Phosphatase: 42 U/L (ref 39–117)
BUN: 12 mg/dL (ref 6–23)
CO2: 27 mEq/L (ref 19–32)
Calcium: 9.1 mg/dL (ref 8.4–10.5)
Chloride: 104 mEq/L (ref 96–112)
Creatinine, Ser: 0.66 mg/dL (ref 0.40–1.20)
GFR: 100.22 mL/min (ref >60.00)
Glucose, Bld: 103 mg/dL — ABNORMAL HIGH (ref 70–99)
Potassium: 3.8 mEq/L (ref 3.5–5.1)
Sodium: 136 mEq/L (ref 135–145)
Total Bilirubin: 0.4 mg/dL (ref 0.2–1.2)
Total Protein: 7.2 g/dL (ref 6.0–8.3)

## 2020-02-12 LAB — METANEPHRINES, URINE, 24 HOUR
Metaneph Total, Ur: 74 ug/L
Metanephrines, 24H Ur: 111 ug/24 hr (ref 36–209)
Normetanephrine, 24H Ur: 261 ug/24 hr (ref 131–612)
Normetanephrine, Ur: 174 ug/L

## 2020-02-12 LAB — CATECHOLAMINES, FRACTIONATED, URINE, 24 HOUR
Dopamine , 24H Ur: 266 ug/24 hr (ref 0–510)
Dopamine, Rand Ur: 177 ug/L
Epinephrine, 24H Ur: 3 ug/24 hr (ref 0–20)
Epinephrine, Rand Ur: 2 ug/L
Norepinephrine, 24H Ur: 32 ug/24 hr (ref 0–135)
Norepinephrine, Rand Ur: 21 ug/L

## 2020-02-17 ENCOUNTER — Encounter: Payer: Self-pay | Admitting: Internal Medicine

## 2021-01-27 ENCOUNTER — Ambulatory Visit: Payer: Commercial Managed Care - PPO | Admitting: Internal Medicine

## 2021-02-24 ENCOUNTER — Ambulatory Visit (INDEPENDENT_AMBULATORY_CARE_PROVIDER_SITE_OTHER): Payer: Commercial Managed Care - PPO | Admitting: Internal Medicine

## 2021-02-24 ENCOUNTER — Other Ambulatory Visit: Payer: Self-pay

## 2021-02-24 ENCOUNTER — Encounter: Payer: Self-pay | Admitting: Internal Medicine

## 2021-02-24 VITALS — BP 102/72 | HR 79 | Ht 68.0 in | Wt 160.2 lb

## 2021-02-24 DIAGNOSIS — R1900 Intra-abdominal and pelvic swelling, mass and lump, unspecified site: Secondary | ICD-10-CM

## 2021-02-24 DIAGNOSIS — D447 Neoplasm of uncertain behavior of aortic body and other paraganglia: Secondary | ICD-10-CM

## 2021-02-24 LAB — CBC
HCT: 42.4 % (ref 36.0–46.0)
Hemoglobin: 14.4 g/dL (ref 12.0–15.0)
MCHC: 33.9 g/dL (ref 30.0–36.0)
MCV: 85.7 fl (ref 78.0–100.0)
Platelets: 252 10*3/uL (ref 150.0–400.0)
RBC: 4.95 Mil/uL (ref 3.87–5.11)
RDW: 12.9 % (ref 11.5–15.5)
WBC: 7.3 10*3/uL (ref 4.0–10.5)

## 2021-02-24 LAB — COMPREHENSIVE METABOLIC PANEL
ALT: 14 U/L (ref 0–35)
AST: 17 U/L (ref 0–37)
Albumin: 4.2 g/dL (ref 3.5–5.2)
Alkaline Phosphatase: 42 U/L (ref 39–117)
BUN: 14 mg/dL (ref 6–23)
CO2: 28 mEq/L (ref 19–32)
Calcium: 9.4 mg/dL (ref 8.4–10.5)
Chloride: 104 mEq/L (ref 96–112)
Creatinine, Ser: 0.68 mg/dL (ref 0.40–1.20)
GFR: 109.96 mL/min (ref >60.00)
Glucose, Bld: 101 mg/dL — ABNORMAL HIGH (ref 70–99)
Potassium: 4 mEq/L (ref 3.5–5.1)
Sodium: 138 mEq/L (ref 135–145)
Total Bilirubin: 0.4 mg/dL (ref 0.2–1.2)
Total Protein: 7.3 g/dL (ref 6.0–8.3)

## 2021-02-24 NOTE — Progress Notes (Signed)
Name: Taylor Pearson  MRN/ DOB: [id number], July 04, 1981    Age/ Sex: 40 y.o., female     PCP: Garlan Fillers, MD   Reason for Endocrinology Evaluation: [facility] mutation     Initial Endocrinology Clinic Visit: 01/23/2019    PATIENT IDENTIFIER: [name] is a 40 y.o., female with unremarkable past medical history. She has followed with [place] [facility] since 01/23/2019 for consultative assistance with management of her [facility] mutation.  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   I have ordered imaging in 2020 but she has not done them due to variable reasons, she understands the importance of imaging as paragangliomas can go prior to becoming excreter he.  Kids 47 and 50 yrs old  [place] maternal uncle with paraganglioma  SUBJECTIVE:    Today (02/24/2021):  [name] is here for [facility] mutation.   Headaches: no HTN: no Palpitations: no Anxiety: no  Sweaty, Clammy sensations:  no  Abdominal Pain: no     HISTORY:  Past Medical History:  Past Medical History:  Diagnosis Date  . Family history of breast cancer   . Family history of familial paraganglioma   . Family history of lung cancer   . Family history of prostate cancer    Past Surgical History:  Past Surgical History:  Procedure Laterality Date  . TONSILLECTOMY      Social History:  reports that she has never smoked. She has never used smokeless tobacco. She reports previous alcohol use. She reports that she does not use drugs. Family History:  Family History  Problem Relation Age of Onset  . Other Mother        Positive for [place] mutation   . Breast cancer Maternal Aunt 31       Genetic testing negative- VUS  . Lung cancer Paternal Uncle   . Prostate cancer Maternal Grandfather 64       also had bile duct or lung cancer?  . Bladder Cancer Maternal Grandfather   . Other  Paternal Grandmother        might have had breast cancer >50  . Breast cancer Maternal Aunt 50       Genetic testing negative- VUS  . Other Maternal Aunt        SDHC+  . Other Maternal Aunt        SDHC+  . Other Maternal Aunt        tumor removed from neck  . Other Cousin        tumors removed from abdomen  . Other Other 61       carotid paraganglioma, [facility]     HOME MEDICATIONS: Allergies as of 02/24/2021      Reactions   Augmentin [amoxicillin-pot Clavulanate]    Rash       Medication List       Accurate as of February 24, 2021  1:02 PM. If you have any questions, ask your nurse or doctor.        multivitamin capsule Take by mouth.         OBJECTIVE:   PHYSICAL EXAM: VS: There were no vitals taken for this visit.   EXAM: General: Pt appears well and is in NAD  Neck: General: Supple without adenopathy. Thyroid: Thyroid size normal.  No goiter or nodules appreciated. No thyroid bruit.  Lungs: Clear with good BS bilat with no rales, rhonchi,  or wheezes  Heart: Auscultation: RRR.  Abdomen: Normoactive bowel sounds, soft, nontender, without masses or organomegaly palpable  Extremities:  BL LE: No pretibial edema normal ROM and strength.  Skin: Hair: Texture and amount normal with gender appropriate distribution Skin Inspection: No rashes Skin Palpation: Skin temperature, texture, and thickness normal to palpation  Mental Status: Judgment, insight: Intact Orientation: Oriented to time, place, and person Mood and affect: No depression, anxiety, or agitation     DATA REVIEWED:  Results for Taylor, Pearson (MRN [id number]) as of 02/28/2021 18:40  Ref. Range 02/24/2021 15:43  Sodium Latest Ref Range: 135 - 145 mEq/L 138  Potassium Latest Ref Range: 3.5 - 5.1 mEq/L 4.0  Chloride Latest Ref Range: 96 - 112 mEq/L 104  CO2 Latest Ref Range: 19 - 32 mEq/L 28  Glucose Latest Ref Range: 70 - 99 mg/dL 101 (H)  BUN Latest Ref Range: 6 - 23 mg/dL 14  Creatinine Latest  Ref Range: 0.40 - 1.20 mg/dL 0.68  Calcium Latest Ref Range: 8.4 - 10.5 mg/dL 9.4  Alkaline Phosphatase Latest Ref Range: 39 - 117 U/L 42  Albumin Latest Ref Range: 3.5 - 5.2 g/dL 4.2  AST Latest Ref Range: 0 - 37 U/L 17  ALT Latest Ref Range: 0 - 35 U/L 14  Total Protein Latest Ref Range: 6.0 - 8.3 g/dL 7.3  Total Bilirubin Latest Ref Range: 0.2 - 1.2 mg/dL 0.4  GFR Latest Ref Range: >60.00 mL/min 109.96  WBC Latest Ref Range: 4.0 - 10.5 K/uL 7.3  RBC Latest Ref Range: 3.87 - 5.11 Mil/uL 4.95  Hemoglobin Latest Ref Range: 12.0 - 15.0 g/dL 14.4  HCT Latest Ref Range: 36.0 - 46.0 % 42.4  MCV Latest Ref Range: 78.0 - 100.0 fl 85.7  MCHC Latest Ref Range: 30.0 - 36.0 g/dL 33.9  RDW Latest Ref Range: 11.5 - 15.5 % 12.9  Platelets Latest Ref Range: 150.0 - 400.0 K/uL 252.0    ASSESSMENT / PLAN / RECOMMENDATIONS:   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 - SDHC mutations is an autosomal dominant with variable penetrance -There is no evidence of clinical catecholamine excess at this time, we will proceed with with plasma catecholamines and metanephrines. -I have ordered a whole-body MRI  In 2020 and 2021 but she does not get them due to variable reasons. We did discuss that in hereditary paragangliomas, these tumors tend to grow prior to becoming excretary and it is beneficial for the patient to diagnose these early on.  Patient expressed understanding -We will consider 123 I-MIBG scintigraphy or an FDG-PET after MRI is done , and this may be done every 5 years - CBC is normal as well as BMP F/U in 1 yr    Signed electronically by: Mack Guise, MD  [facility] Endocrinology  [facility] [address] [place], [place] [address] Phone: [phone] FAX: [phone]      CC: Garlan Fillers, [place] [address] Phone:  [phone]  Fax: [phone]   Return to Endocrinology clinic as below: Future Appointments  Date Time Provider [place]  02/24/2021  3:00 PM Vylet Maffia, Melanie Crazier, MD [facility] None

## 2021-03-03 ENCOUNTER — Encounter: Payer: Self-pay | Admitting: Internal Medicine

## 2021-03-07 LAB — METANEPHRINES, PLASMA
Metanephrine, Free: <25 pg/mL (ref <=57)
Normetanephrine, Free: 75 pg/mL (ref <=148)
Total Metanephrines-Plasma: 75 pg/mL (ref <=205)

## 2021-03-07 LAB — CATECHOLAMINES, FRACTIONATED, PLASMA
Dopamine: <10 pg/mL
Epinephrine: 20 pg/mL
Norepinephrine: 572 pg/mL
Total Catecholamines: 592 pg/mL

## 2021-03-15 ENCOUNTER — Ambulatory Visit (HOSPITAL_COMMUNITY)
Admission: RE | Admit: 2021-03-15 | Discharge: 2021-03-15 | Disposition: A | Discharge status: Home / Self Care | Payer: Commercial Managed Care - PPO | Source: Ambulatory Visit | Attending: Internal Medicine | Admitting: Internal Medicine

## 2021-03-15 ENCOUNTER — Other Ambulatory Visit (HOSPITAL_COMMUNITY): Payer: Commercial Managed Care - PPO

## 2021-03-15 ENCOUNTER — Other Ambulatory Visit: Payer: Self-pay

## 2021-03-15 ENCOUNTER — Ambulatory Visit (HOSPITAL_COMMUNITY): Payer: Commercial Managed Care - PPO

## 2021-03-15 DIAGNOSIS — D447 Neoplasm of uncertain behavior of aortic body and other paraganglia: Secondary | ICD-10-CM | POA: Insufficient documentation

## 2021-03-15 IMAGING — MR MR PELVIS WO/W CM
39 of 40 series · 47 of 48 positions shown · IV contrast (gadavist)
Comparison: None.

CLINICAL DATA: History of succinate dehydrogenase mutations
high-risk for pheochromocytoma and Peri ganglioma.

EXAM:
MRI CHEST, ABDOMEN AND PELVIS WITHOUT AND WITH CONTRAST
TECHNIQUE: Multiplanar multisequence MR imaging of the chest, abdomen and
pelvis was performed both before and after the administration of
intravenous contrast.
CONTRAST:  7mL GADAVIST GADOBUTROL 1 MMOL/ML IV SOLN

[Series 2: bSSFP · coronal · 6.0mm · 0.70mm/px · 1 of 40 slices shown (1 of 2)]
[im 1/40]
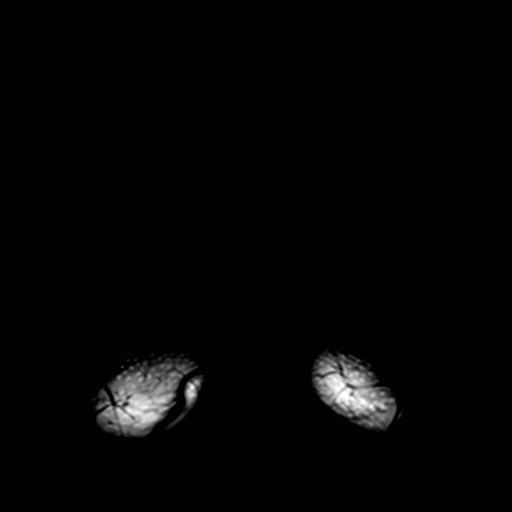

[Series 3: T1 · axial · 3.0mm · 1.25mm/px · 1 of 88 slices shown (1 of 5)]
[im 1/88]
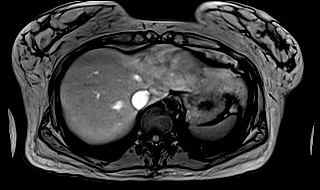

[Series 4: T1 · axial · 3.0mm · 1.25mm/px · 1 of 88 slices shown (2 of 5)]
[im 1/88]
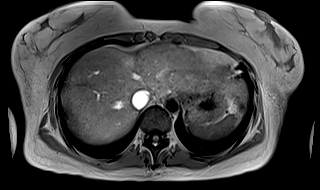

[Series 5: T2 · coronal · 6.0mm · 1.41mm/px · 1 of 40 slices shown (1 of 8)]
[im 1/40]
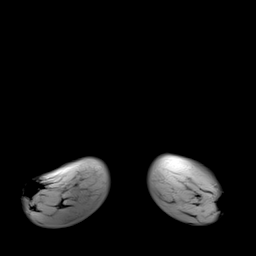

[Series 6: T2 fat-sat · coronal · 6.0mm · 1.41mm/px · 1 of 40 slices shown (1 of 4)]
[im 1/40]
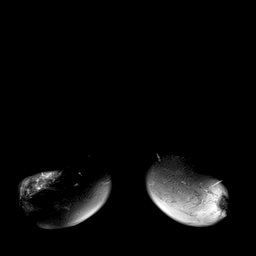

[Series 7: T2 fat-sat · axial · 6.0mm · 1.56mm/px · 1 of 40 slices shown (2 of 4)]
[im 1/40]
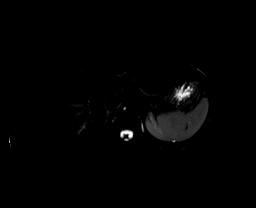

[Series 8: T2 · axial · 6.0mm · 1.56mm/px · 1 of 40 slices shown (2 of 8)]
[im 1/40]
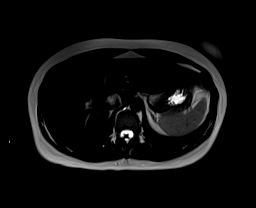

[Series 9: DWI · axial · 6.0mm · 1.42mm/px · 1 of 90 slices shown (1 of 6)]
[im 1/90]
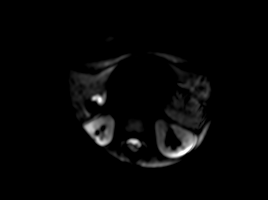

[Series 10: DWI · axial · 6.0mm · 1.42mm/px · 1 of 45 slices shown (2 of 6)]
[im 1/45]
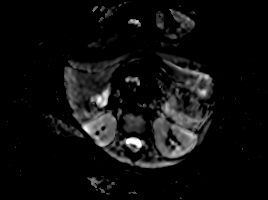

[Series 12: T1 dynamic · axial · 3.3mm · 1.06mm/px · 1 of 96 slices shown (1 of 12)]
[im 1/96]
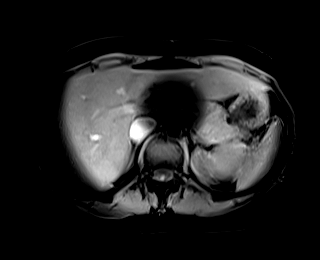

[Series 15: T2 · axial · 5.0mm · 0.51mm/px · 1 of 42 slices shown (3 of 8)]
[im 1/42]
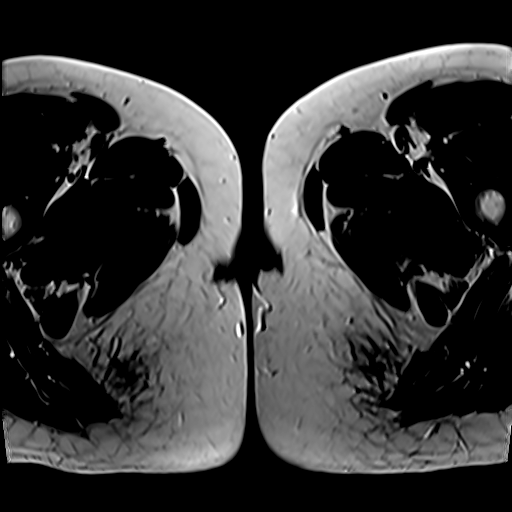

[Series 16: T2 · coronal · 6.0mm · 1.68mm/px · 1 of 30 slices shown (4 of 8)]
[im 1/30]
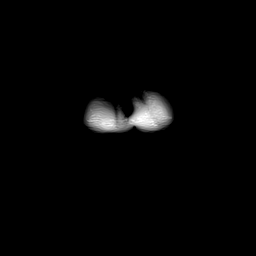

[Series 17: T2 · coronal · 6.0mm · 1.68mm/px · 1 of 30 slices shown (5 of 8)]
[im 1/30]
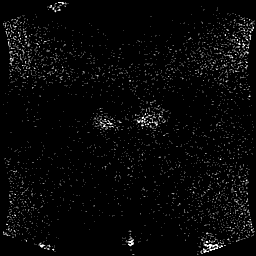

[Series 18: T2 · sagittal · 5.0mm · 0.55mm/px · 1 of 46 slices shown (6 of 8)]
[im 1/46]
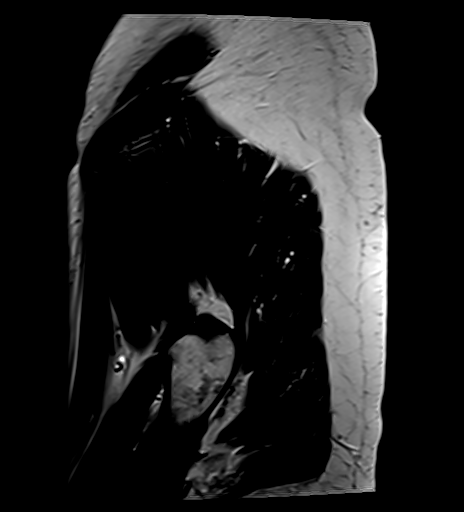

[Series 19: T1 · axial · 5.0mm · 0.51mm/px · 1 of 42 slices shown (3 of 5)]
[im 1/42]
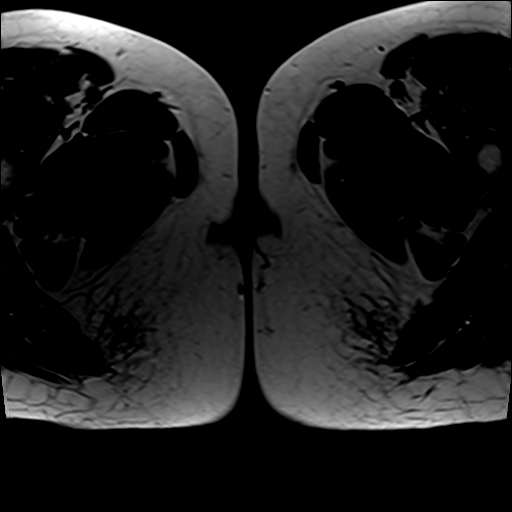

[Series 20: DWI · axial · 6.0mm · 1.36mm/px · 1 of 84 slices shown (3 of 6)]
[im 1/84]
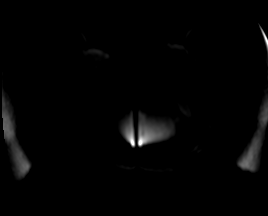

[Series 21: DWI · axial · 6.0mm · 1.36mm/px · 1 of 42 slices shown (4 of 6)]
[im 1/42]
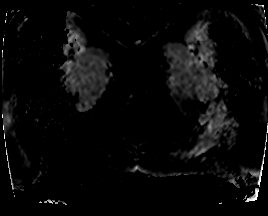

[Series 22: T1 fat-sat · axial · non-contrast · 5.0mm · 0.51mm/px · 1 of 46 slices shown]
[im 1/46]
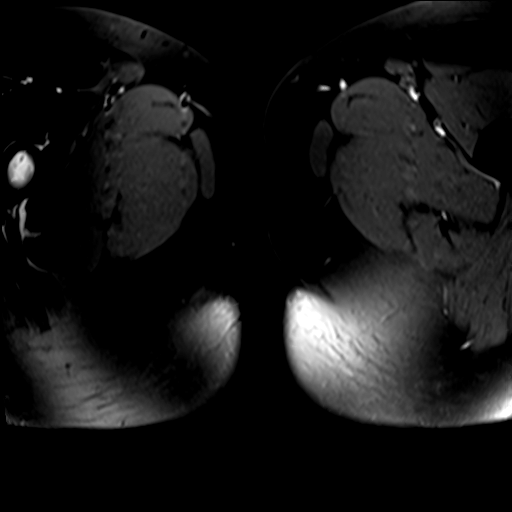

[Series 24: bSSFP · axial · 4.0mm · 0.80mm/px · 1 of 72 slices shown (2 of 2)]
[im 1/72]
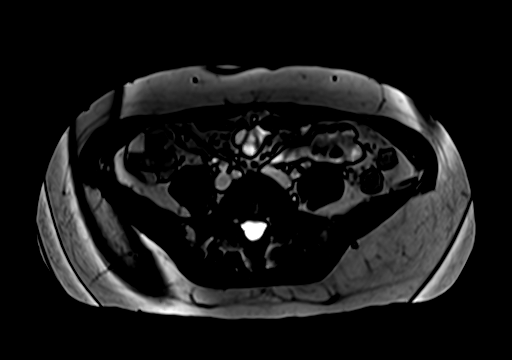

[Series 25: T2 fat-sat · axial · 4.0mm · 1.52mm/px · 1 of 68 slices shown (3 of 4)]
[im 1/68]
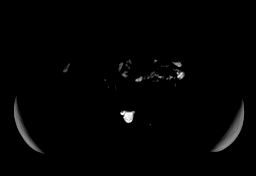

[Series 26: T2 · axial · 4.0mm · 1.52mm/px · 1 of 68 slices shown (7 of 8)]
[im 1/68]
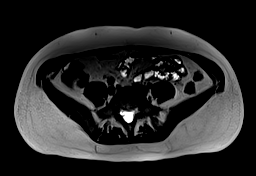

[Series 27: T1 · axial · 3.0mm · 1.25mm/px · 1 of 88 slices shown (4 of 5)]
[im 1/88]
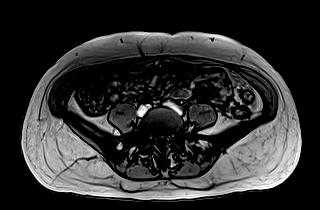

[Series 28: T1 · axial · 3.0mm · 1.25mm/px · 1 of 88 slices shown (5 of 5)]
[im 1/88]
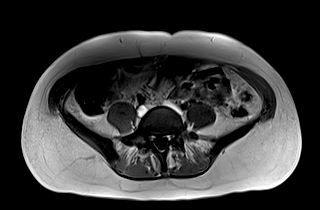

[Series 29: DWI · axial · 6.0mm · 1.42mm/px · 1 of 76 slices shown (5 of 6)]
[im 1/76]
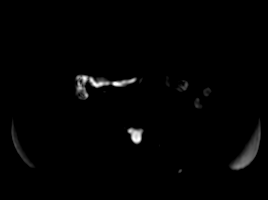

[Series 30: DWI · axial · 6.0mm · 1.42mm/px · 1 of 38 slices shown (6 of 6)]
[im 1/38]
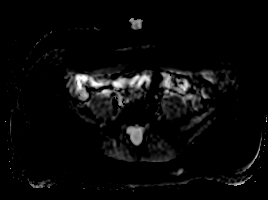

[Series 33: T2 fat-sat · axial · 6.0mm · 1.56mm/px · 1 of 37 slices shown (4 of 4)]
[im 1/37]
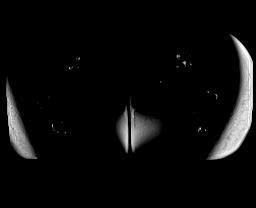

[Series 34: T2 · axial · 6.0mm · 1.56mm/px · 1 of 37 slices shown (8 of 8)]
[im 1/37]
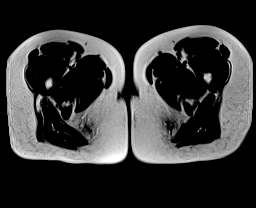

[Series 36: T1 dynamic · axial · 3.0mm · 1.25mm/px · 1 of 96 slices shown (2 of 12)]
[im 1/96]
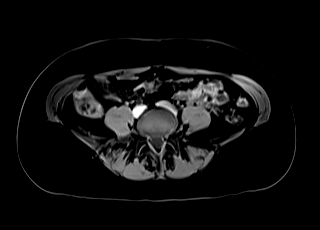

[Series 40: T1 dynamic · axial · 3.0mm · 1.25mm/px · 1 of 96 slices shown (3 of 12)]
[im 1/96]
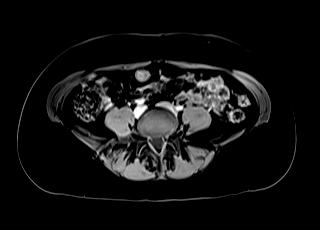

[Series 41: T1 dynamic · axial · 3.0mm · 1.25mm/px · 1 of 96 slices shown (4 of 12)]
[im 1/96]
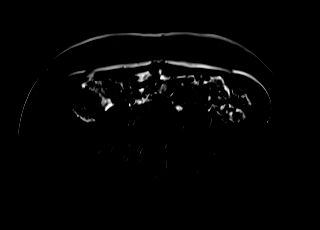

[Series 44: T1 dynamic · axial · 3.0mm · 1.25mm/px · z∈[-243,+42]mm · 2 of 96 slices shown (5 of 12)]
[im 1/96]
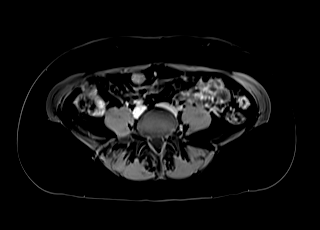
[im 96/96]
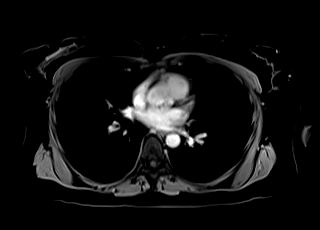

[Series 45: T1 dynamic · axial · 3.0mm · 1.25mm/px · z∈[-243,+42]mm · 2 of 96 slices shown (6 of 12)]
[im 1/96]
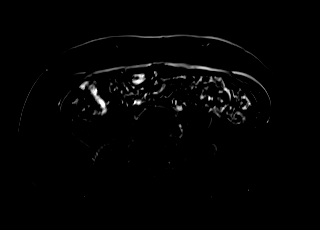
[im 96/96]
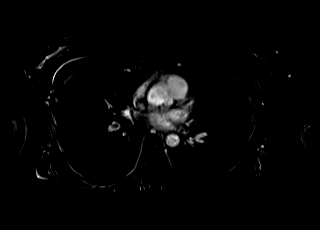

[Series 48: T1 dynamic · axial · 3.0mm · 1.25mm/px · z∈[-243,+42]mm · 2 of 96 slices shown (7 of 12)]
[im 1/96]
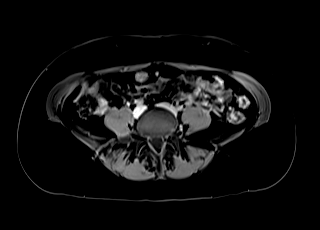
[im 96/96]
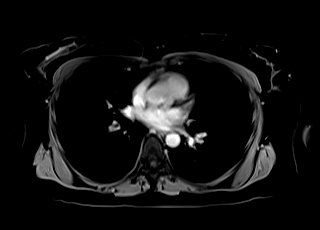

[Series 49: T1 dynamic · axial · 3.0mm · 1.25mm/px · z∈[-243,+42]mm · 2 of 96 slices shown (8 of 12)]
[im 1/96]
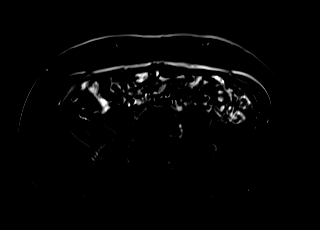
[im 96/96]
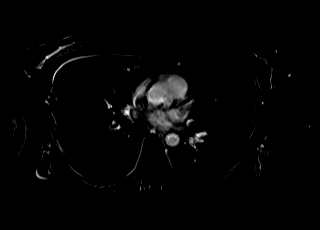

[Series 50: T1 fat-sat post-contrast · axial · 5.0mm · 0.51mm/px · 1 of 46 slices shown]
[im 1/46]
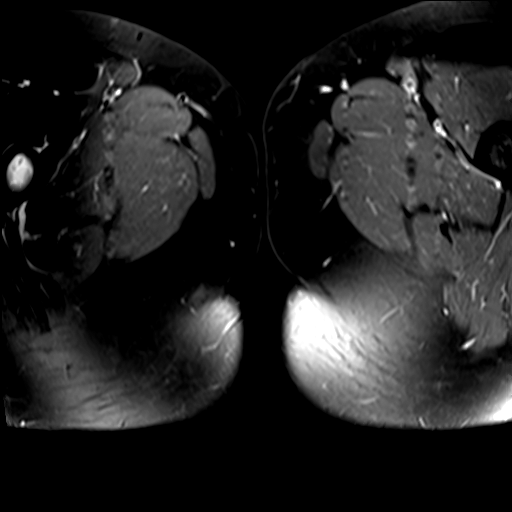

[Series 53: T1 dynamic · axial · 3.0mm · 1.25mm/px · z∈[-243,+42]mm · 2 of 96 slices shown (9 of 12)]
[im 1/96]
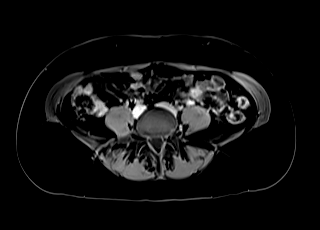
[im 96/96]
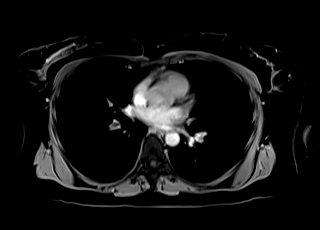

[Series 54: T1 dynamic · axial · 3.0mm · 1.25mm/px · z∈[-243,+42]mm · 2 of 96 slices shown (10 of 12)]
[im 1/96]
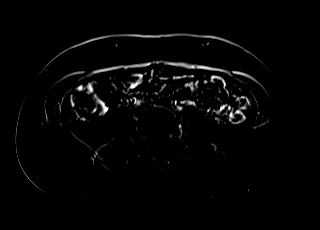
[im 96/96]
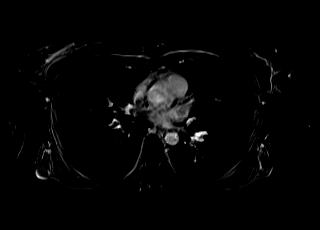

[Series 57: T1 dynamic · axial · 3.3mm · 1.06mm/px · z∈[-93,+221]mm · 2 of 96 slices shown (11 of 12)]
[im 1/96]
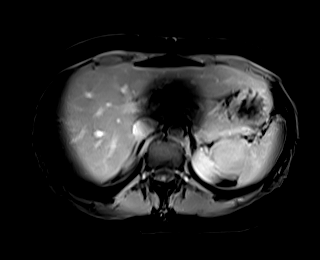
[im 96/96]
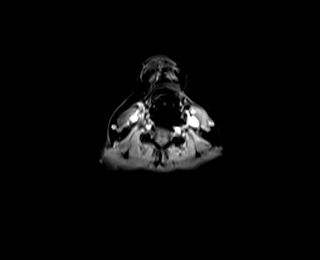

[Series 58: T1 dynamic · axial · 3.3mm · 1.06mm/px · z∈[-93,+221]mm · 2 of 96 slices shown (12 of 12)]
[im 1/96]
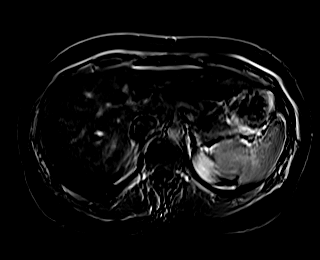
[im 96/96]
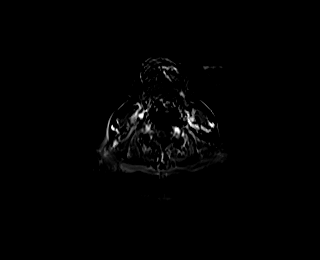

[47 of 48 positions shown; findings below may reference images not displayed]

FINDINGS: COMBINED FINDINGS FOR BOTH MR CHEST, ABDOMEN AND PELVIS

Cardiovascular: Normal heart size, no pericardial effusion. Normal
caliber of the thoracic aorta.

Mediastinum/Nodes: RIGHT thyroid lesion better seen on MRI of the
neck of the same date. No adenopathy in the chest. No perispinal
mass lesion. Grossly normal esophagus.

Lungs/Pleura: Lungs not well assessed on MRI. No gross
consolidation, effusion or visible lesion.

Musculoskeletal: No chest wall mass. No acute musculoskeletal
process or suspicious bone finding about the bony thorax.

Hepatobiliary: No focal, suspicious hepatic lesion. Cholelithiasis
with 1 cm gallstone in the gallbladder neck. No biliary duct
dilation. No pericholecystic stranding.

Pancreas: Normal intrinsic T1 signal in the pancreas. No sign of
peripancreatic inflammation or adjacent lesion.

Spleen:  Normal size and contour without focal lesion.

Adrenals/Urinary Tract:  Normal adrenal glands.

Symmetric renal enhancement. No hydronephrosis. No perinephric
stranding.

Stomach/Bowel: No acute gastrointestinal process. Study not
performed for bowel evaluation.

Vascular/Lymphatic: Vascular structures without dilation. No
adenopathy in the abdomen or in the pelvis.

Reproductive: IUD in situ. Uterus and adnexa otherwise unremarkable.

Other:  None

Musculoskeletal: No suspicious bone lesions identified.
IMPRESSION: 1. No suspicious lesion in the chest, abdomen or pelvis. No solid
organ lesion or adrenal abnormality.
2. Thyroid lesion better seen on MRI of the neck of the same date.
Ultrasound follow-up suggested for further evaluation.
3. Cholelithiasis with 1 cm gallstone in the gallbladder neck. No
biliary duct dilation.

## 2021-03-15 IMAGING — MR MR NECK SOFT TISSUE ONLY WO/W CM
13 series · 48 of 48 positions shown · IV contrast (gadavist)
Comparison: None.

CLINICAL DATA: SDH see mutations with high risk for
pheochromocytoma, paragangliomas

EXAM:
MRI OF THE NECK WITH CONTRAST
TECHNIQUE: Multiplanar, multisequence MR imaging was performed following the
administration of intravenous contrast.
CONTRAST:  7mL GADAVIST GADOBUTROL 1 MMOL/ML IV SOLN

[Series 5: T1 · coronal · 5.0mm · 0.75mm/px · 2 of 27 slices shown (1 of 3)]
[im 1/27]
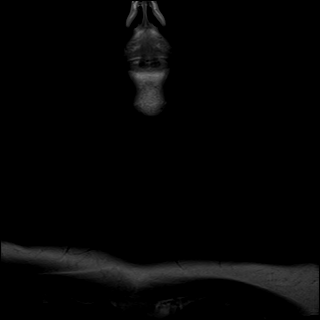
[im 27/27]
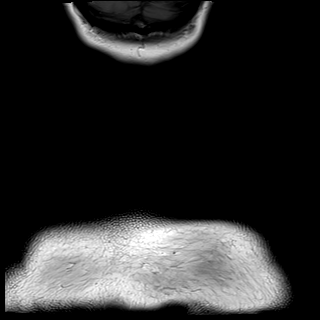

[Series 6: T1 · sagittal · 5.0mm · 0.75mm/px · 3 of 30 slices shown (2 of 3)]
[im 1/30]
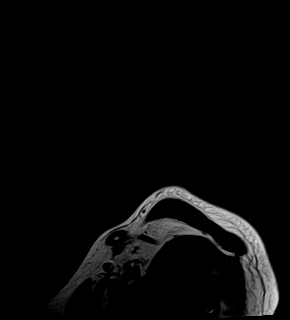
[im 15/30]
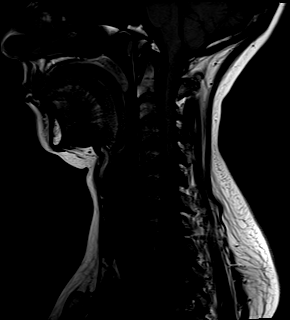
[im 30/30]
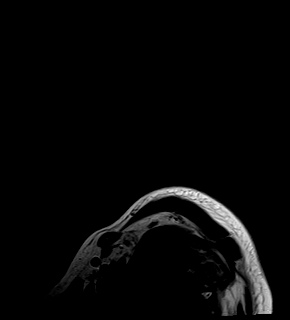

[Series 7: T1 · axial · 5.0mm · 0.75mm/px · z∈[-89,+123]mm · 4 of 37 slices shown (3 of 3)]
[im 1/37]
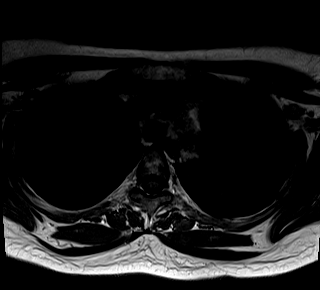
[im 13/37]
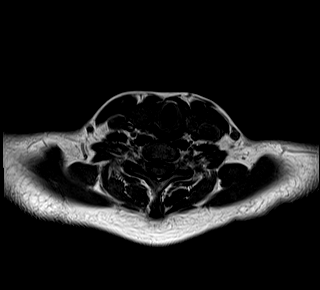
[im 25/37]
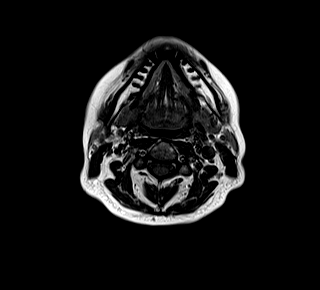
[im 37/37]
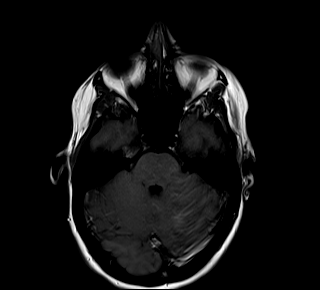

[Series 9: T2 fat-sat · coronal · 5.0mm · 0.80mm/px · 3 of 27 slices shown (1 of 6)]
[im 1/27]
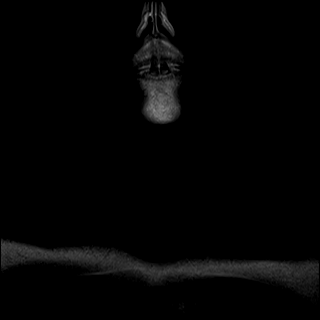
[im 14/27]
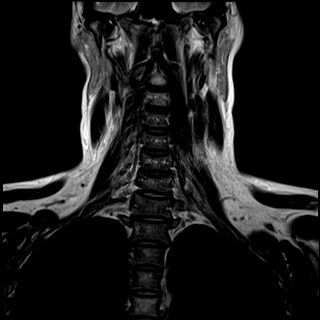
[im 27/27]
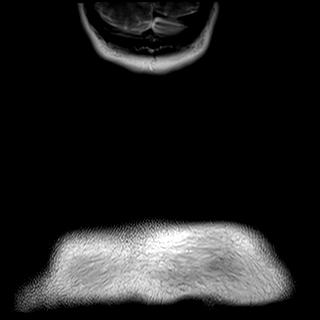

[Series 11: T2 fat-sat · coronal · 5.0mm · 0.80mm/px · 3 of 27 slices shown (2 of 6)]
[im 1/27]
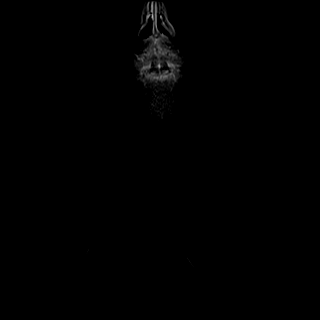
[im 14/27]
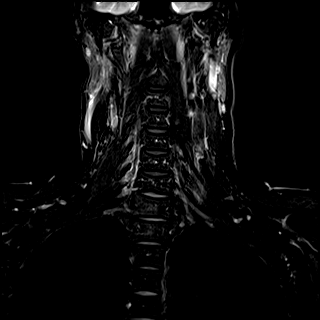
[im 27/27]
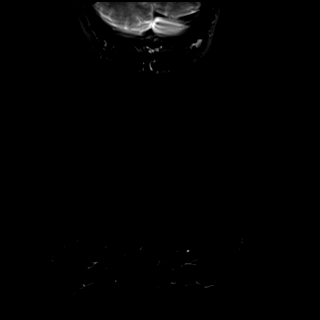

[Series 13: T2 fat-sat · sagittal · 5.0mm · 0.75mm/px · 3 of 30 slices shown (3 of 6)]
[im 1/30]
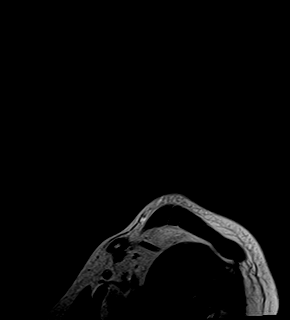
[im 15/30]
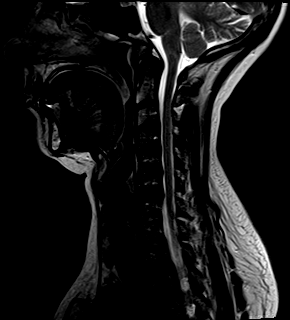
[im 30/30]
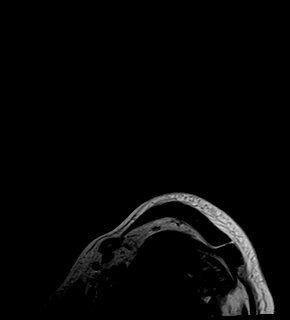

[Series 15: T2 fat-sat · sagittal · 5.0mm · 0.75mm/px · 3 of 30 slices shown (4 of 6)]
[im 1/30]
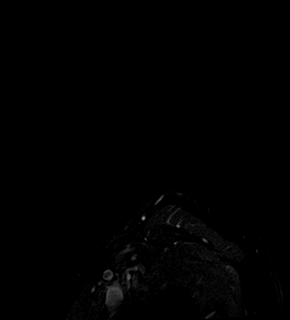
[im 15/30]
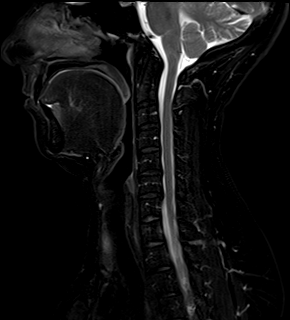
[im 30/30]
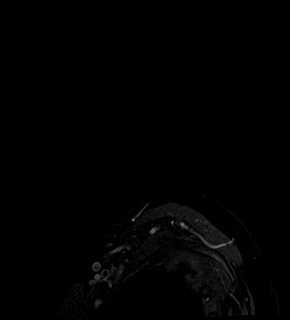

[Series 17: T2 fat-sat · axial · 5.0mm · 0.75mm/px · z∈[-91,+121]mm · 4 of 37 slices shown (5 of 6)]
[im 1/37]
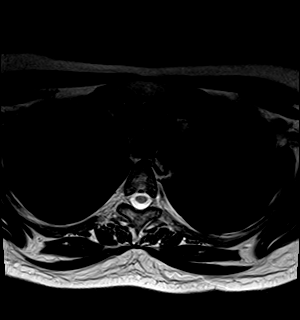
[im 13/37]
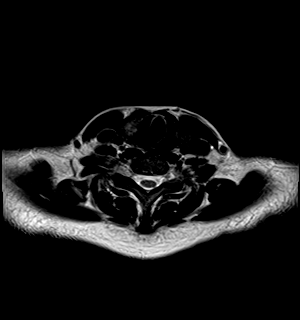
[im 25/37]
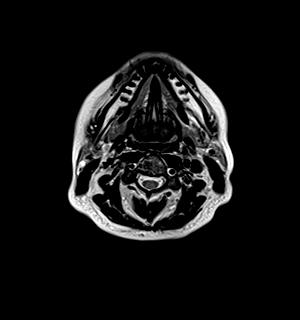
[im 37/37]
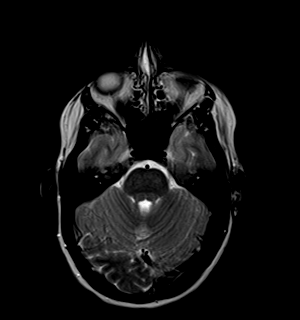

[Series 19: T2 fat-sat · axial · 5.0mm · 0.75mm/px · z∈[-91,+121]mm · 4 of 37 slices shown (6 of 6)]
[im 1/37]
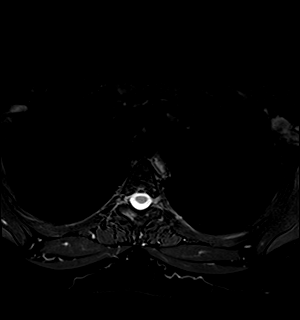
[im 13/37]
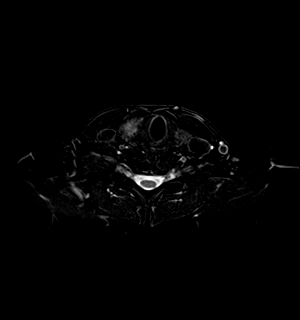
[im 25/37]
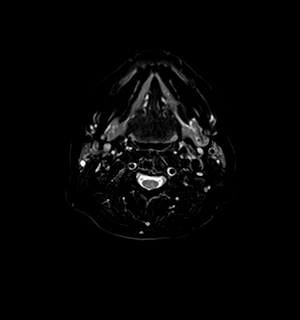
[im 37/37]
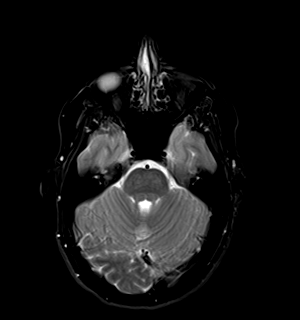

[Series 20: T1 dynamic fat-sat · axial · non-contrast · 4.0mm · 0.94mm/px · z∈[-101,+131]mm · 6 of 60 slices shown]
[im 1/60]
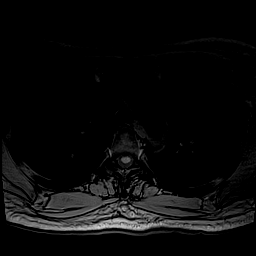
[im 12/60]
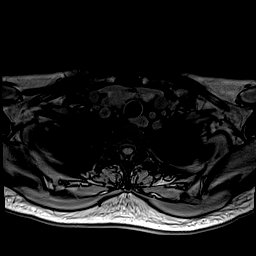
[im 24/60]
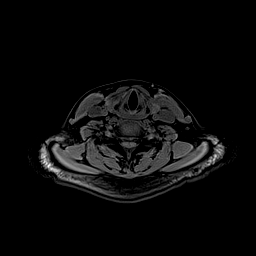
[im 36/60]
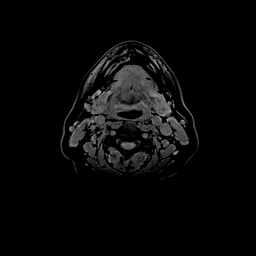
[im 48/60]
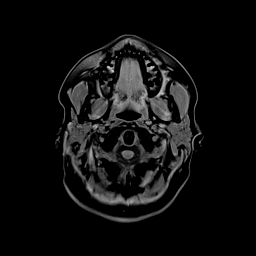
[im 60/60]
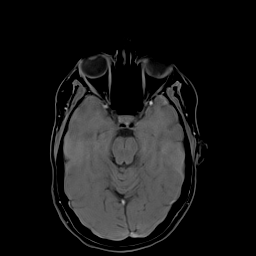

[Series 25: T1 fat-sat post-contrast · axial · 4.0mm · 0.94mm/px · z∈[-81,+138]mm · 6 of 56 slices shown (1 of 3)]
[im 1/56]
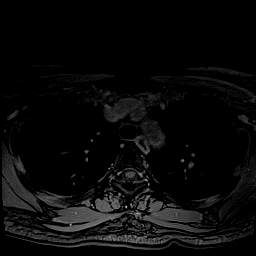
[im 12/56]
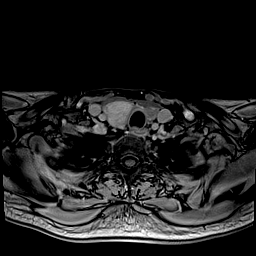
[im 23/56]
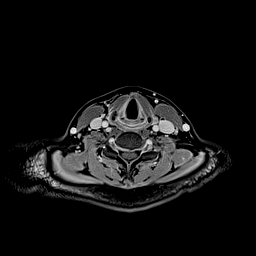
[im 34/56]
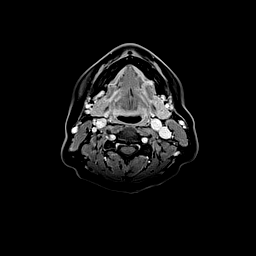
[im 45/56]
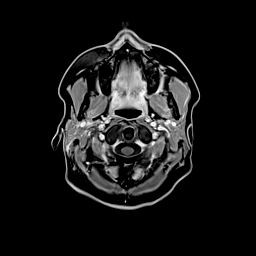
[im 56/56]
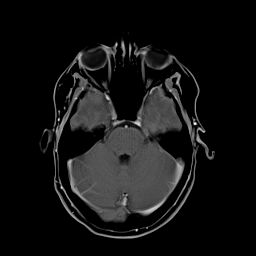

[Series 29: T1 fat-sat post-contrast · coronal · 5.0mm · 0.80mm/px · 3 of 32 slices shown (2 of 3)]
[im 1/32]
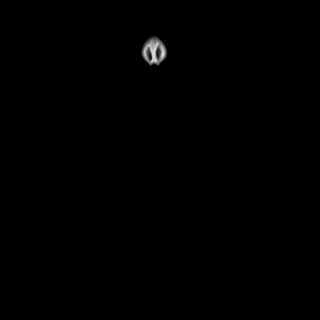
[im 16/32]
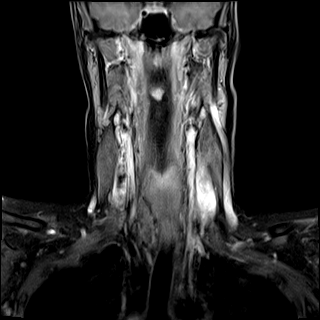
[im 32/32]
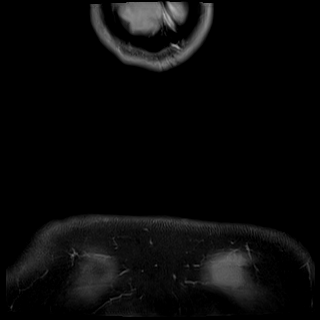

[Series 33: T1 fat-sat post-contrast · sagittal · 5.0mm · 0.80mm/px · 4 of 34 slices shown (3 of 3)]
[im 1/34]
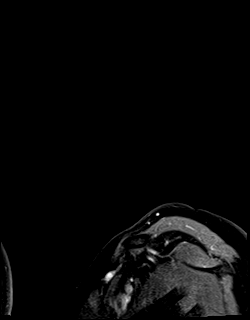
[im 12/34]
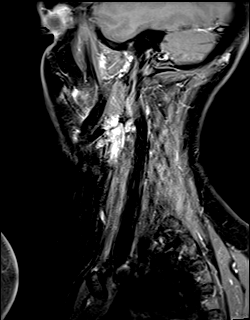
[im 23/34]
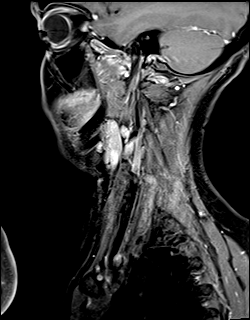
[im 34/34]
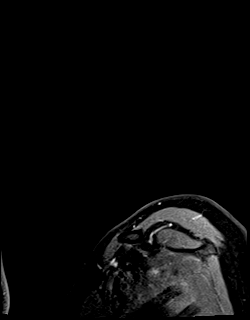

[48 of 48 positions shown; findings below may reference images not displayed]

FINDINGS: Pharynx and larynx: Unremarkable.  No mass or swelling.

Salivary glands: Unremarkable.

Thyroid: 3.4 cm right thyroid nodule.

Lymph nodes: No enlarged or abnormal intensity nodes.

Vascular: Major neck vessels are patent.

Limited intracranial: Unremarkable.

Visualized orbits: Minimally included.

Mastoids and visualized paranasal sinuses: No significant
opacification.

Skeleton: Marrow signal is within normal limits.

Upper chest: No apical mass.

Other: None.
IMPRESSION: 3.4 cm right thyroid nodule. Ultrasound recommended for further
evaluation.

Otherwise unremarkable study.

## 2021-03-15 IMAGING — MR MR CHEST MEDIASTINUM WO/W CM
40 series · 40 of 40 positions shown · IV contrast (gadavist)
Comparison: None.

CLINICAL DATA: History of succinate dehydrogenase mutations
high-risk for pheochromocytoma and Peri ganglioma.

EXAM:
MRI CHEST, ABDOMEN AND PELVIS WITHOUT AND WITH CONTRAST
TECHNIQUE: Multiplanar multisequence MR imaging of the chest, abdomen and
pelvis was performed both before and after the administration of
intravenous contrast.
CONTRAST:  7mL GADAVIST GADOBUTROL 1 MMOL/ML IV SOLN

[Series 2: bSSFP · coronal · 6.0mm · 0.70mm/px · 1 of 40 slices shown (1 of 2)]
[im 1/40]
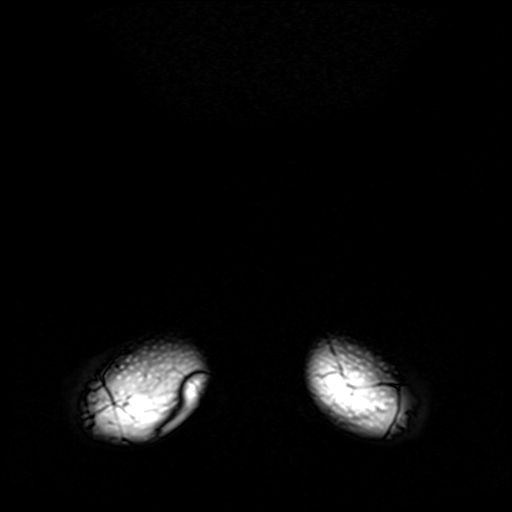

[Series 3: T1 · axial · 3.0mm · 1.25mm/px · 1 of 88 slices shown (1 of 5)]
[im 1/88]
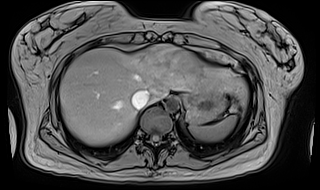

[Series 4: T1 · axial · 3.0mm · 1.25mm/px · 1 of 88 slices shown (2 of 5)]
[im 1/88]
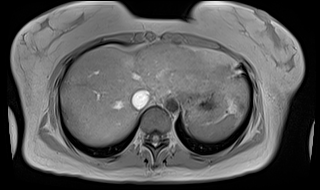

[Series 5: T2 · coronal · 6.0mm · 1.41mm/px · 1 of 40 slices shown (1 of 8)]
[im 1/40]
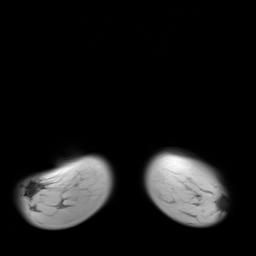

[Series 6: T2 fat-sat · coronal · 6.0mm · 1.41mm/px · 1 of 40 slices shown (1 of 4)]
[im 1/40]
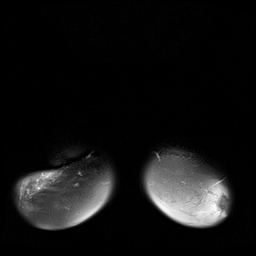

[Series 7: T2 fat-sat · axial · 6.0mm · 1.56mm/px · 1 of 40 slices shown (2 of 4)]
[im 1/40]
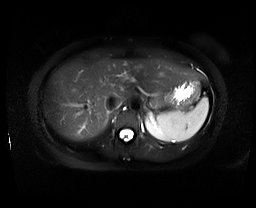

[Series 8: T2 · axial · 6.0mm · 1.56mm/px · 1 of 40 slices shown (2 of 8)]
[im 1/40]
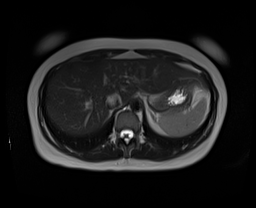

[Series 9: DWI · axial · 6.0mm · 1.42mm/px · 1 of 90 slices shown (1 of 6)]
[im 1/90]
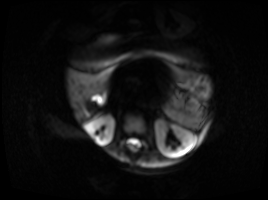

[Series 10: DWI · axial · 6.0mm · 1.42mm/px · 1 of 45 slices shown (2 of 6)]
[im 1/45]
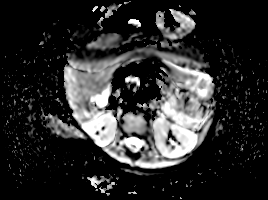

[Series 12: T1 dynamic · axial · 3.3mm · 1.06mm/px · 1 of 96 slices shown (1 of 12)]
[im 1/96]
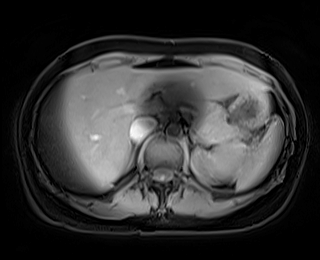

[Series 15: T2 · axial · 5.0mm · 0.51mm/px · 1 of 42 slices shown (3 of 8)]
[im 1/42]
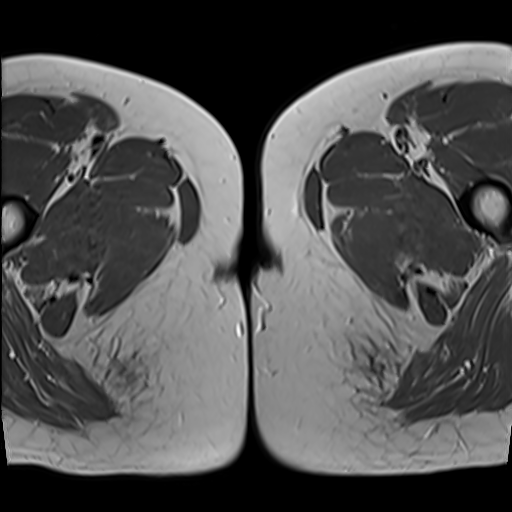

[Series 16: T2 · coronal · 6.0mm · 1.68mm/px · 1 of 30 slices shown (4 of 8)]
[im 1/30]
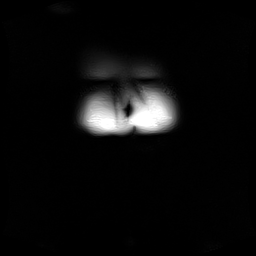

[Series 17: T2 · coronal · 6.0mm · 1.68mm/px · 1 of 30 slices shown (5 of 8)]
[im 1/30]
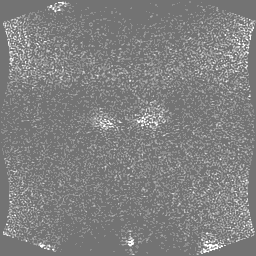

[Series 18: T2 · sagittal · 5.0mm · 0.55mm/px · 1 of 46 slices shown (6 of 8)]
[im 1/46]
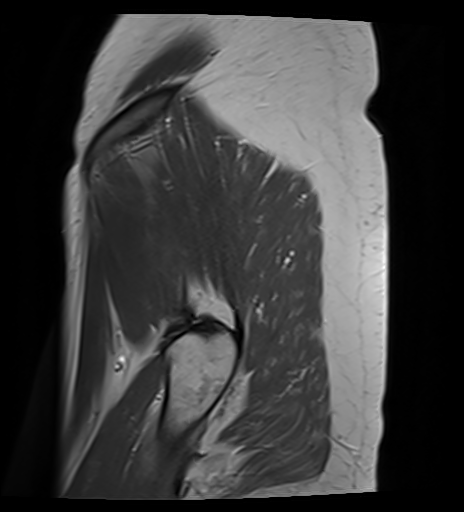

[Series 19: T1 · axial · 5.0mm · 0.51mm/px · 1 of 42 slices shown (3 of 5)]
[im 1/42]
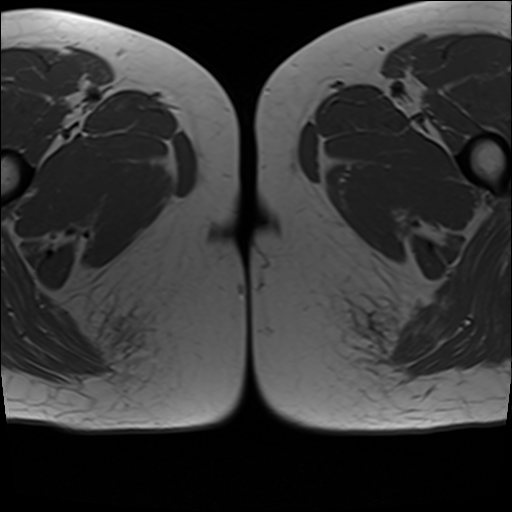

[Series 20: DWI · axial · 6.0mm · 1.36mm/px · 1 of 84 slices shown (3 of 6)]
[im 1/84]
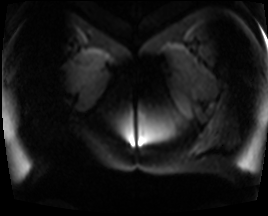

[Series 21: DWI · axial · 6.0mm · 1.36mm/px · 1 of 42 slices shown (4 of 6)]
[im 1/42]
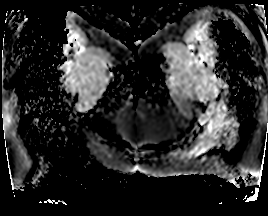

[Series 22: T1 fat-sat · axial · non-contrast · 5.0mm · 0.51mm/px · 1 of 46 slices shown]
[im 1/46]
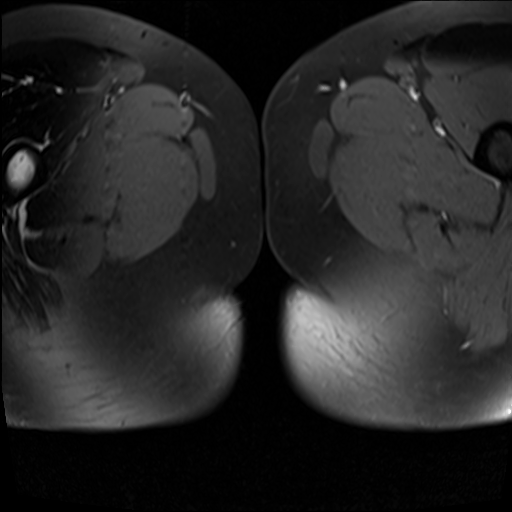

[Series 24: bSSFP · axial · 4.0mm · 0.80mm/px · 1 of 72 slices shown (2 of 2)]
[im 1/72]
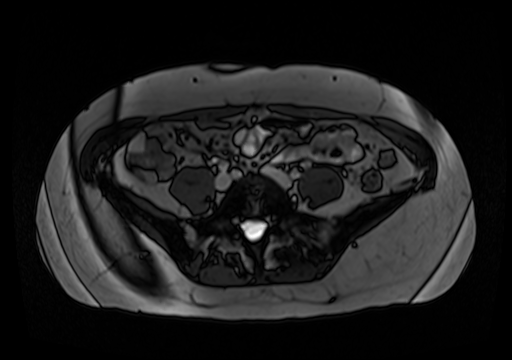

[Series 25: T2 fat-sat · axial · 4.0mm · 1.52mm/px · 1 of 68 slices shown (3 of 4)]
[im 1/68]
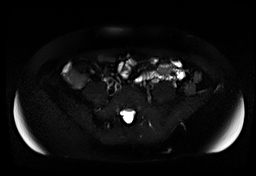

[Series 26: T2 · axial · 4.0mm · 1.52mm/px · 1 of 68 slices shown (7 of 8)]
[im 1/68]
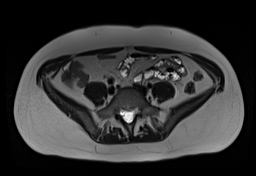

[Series 27: T1 · axial · 3.0mm · 1.25mm/px · 1 of 88 slices shown (4 of 5)]
[im 1/88]
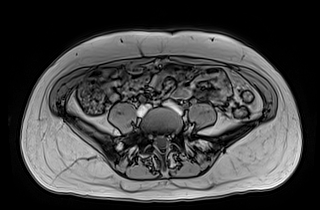

[Series 28: T1 · axial · 3.0mm · 1.25mm/px · 1 of 88 slices shown (5 of 5)]
[im 1/88]
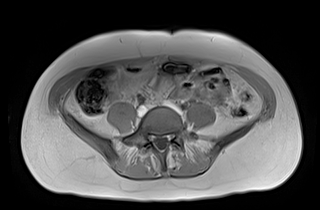

[Series 29: DWI · axial · 6.0mm · 1.42mm/px · 1 of 76 slices shown (5 of 6)]
[im 1/76]
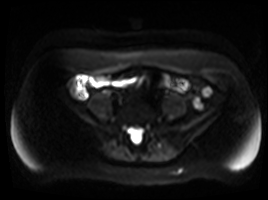

[Series 30: DWI · axial · 6.0mm · 1.42mm/px · 1 of 38 slices shown (6 of 6)]
[im 1/38]
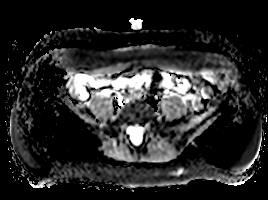

[Series 33: T2 fat-sat · axial · 6.0mm · 1.56mm/px · 1 of 37 slices shown (4 of 4)]
[im 1/37]
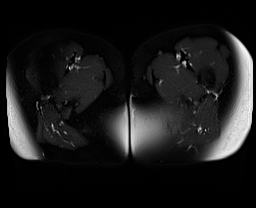

[Series 34: T2 · axial · 6.0mm · 1.56mm/px · 1 of 37 slices shown (8 of 8)]
[im 1/37]
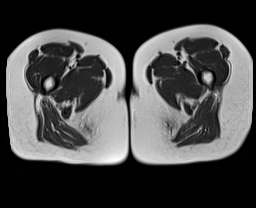

[Series 36: T1 dynamic · axial · 3.0mm · 1.25mm/px · 1 of 96 slices shown (2 of 12)]
[im 1/96]
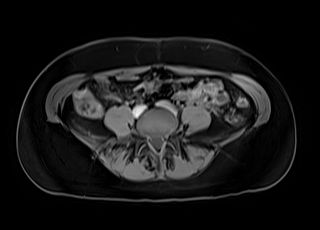

[Series 40: T1 dynamic · axial · 3.0mm · 1.25mm/px · 1 of 96 slices shown (3 of 12)]
[im 1/96]
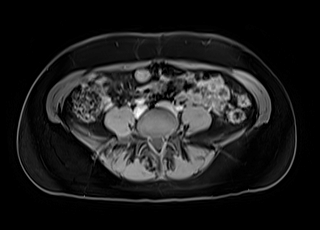

[Series 41: T1 dynamic · axial · 3.0mm · 1.25mm/px · 1 of 96 slices shown (4 of 12)]
[im 1/96]
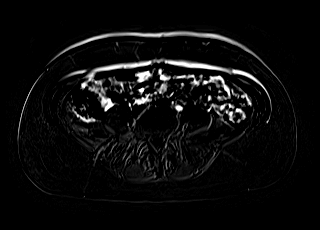

[Series 44: T1 dynamic · axial · 3.0mm · 1.25mm/px · 1 of 96 slices shown (5 of 12)]
[im 1/96]
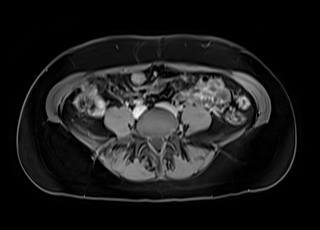

[Series 45: T1 dynamic · axial · 3.0mm · 1.25mm/px · 1 of 96 slices shown (6 of 12)]
[im 1/96]
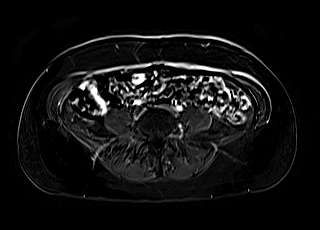

[Series 48: T1 dynamic · axial · 3.0mm · 1.25mm/px · 1 of 96 slices shown (7 of 12)]
[im 1/96]
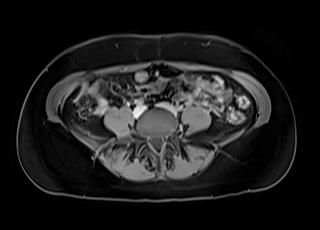

[Series 49: T1 dynamic · axial · 3.0mm · 1.25mm/px · 1 of 96 slices shown (8 of 12)]
[im 1/96]
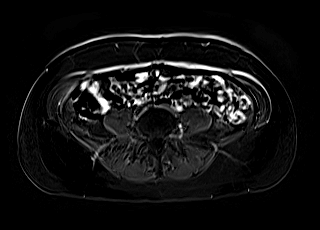

[Series 50: T1 fat-sat post-contrast · axial · 5.0mm · 0.51mm/px · 1 of 46 slices shown]
[im 1/46]
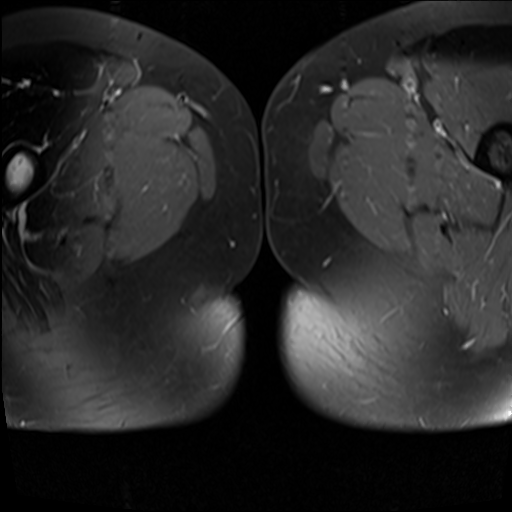

[Series 53: T1 dynamic · axial · 3.0mm · 1.25mm/px · 1 of 96 slices shown (9 of 12)]
[im 1/96]
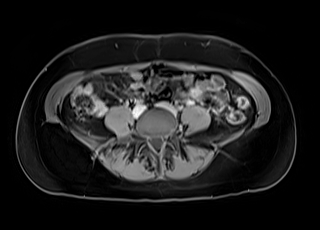

[Series 54: T1 dynamic · axial · 3.0mm · 1.25mm/px · 1 of 96 slices shown (10 of 12)]
[im 1/96]
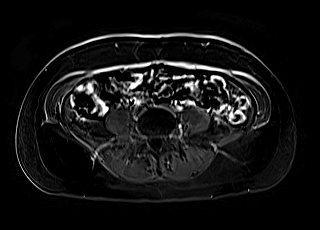

[Series 57: T1 dynamic · axial · 3.3mm · 1.06mm/px · 1 of 96 slices shown (11 of 12)]
[im 1/96]
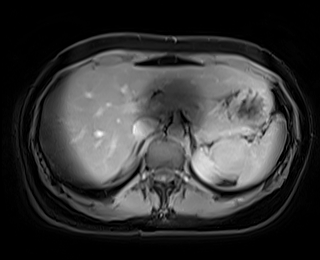

[Series 58: T1 dynamic · axial · 3.3mm · 1.06mm/px · 1 of 96 slices shown (12 of 12)]
[im 1/96]
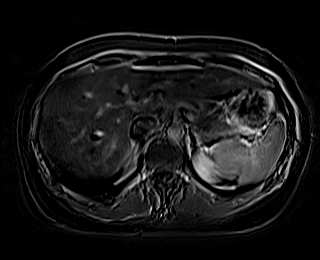

[Series 100: sub pelvis · axial · 5.0mm · 0.51mm/px · 1 of 46 slices shown]
[im 1/46]
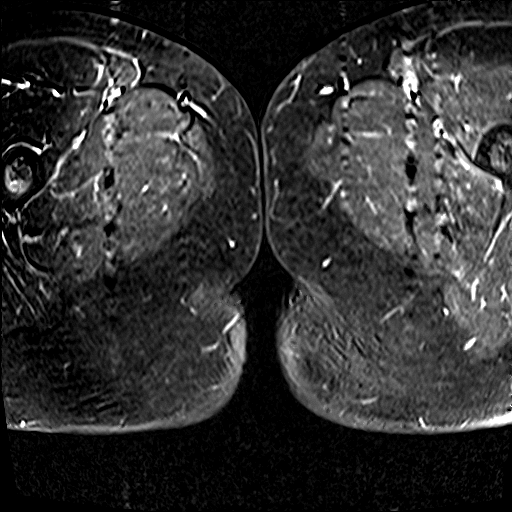

[40 of 40 positions shown; findings below may reference images not displayed]

FINDINGS: COMBINED FINDINGS FOR BOTH MR CHEST, ABDOMEN AND PELVIS

Cardiovascular: Normal heart size, no pericardial effusion. Normal
caliber of the thoracic aorta.

Mediastinum/Nodes: RIGHT thyroid lesion better seen on MRI of the
neck of the same date. No adenopathy in the chest. No perispinal
mass lesion. Grossly normal esophagus.

Lungs/Pleura: Lungs not well assessed on MRI. No gross
consolidation, effusion or visible lesion.

Musculoskeletal: No chest wall mass. No acute musculoskeletal
process or suspicious bone finding about the bony thorax.

Hepatobiliary: No focal, suspicious hepatic lesion. Cholelithiasis
with 1 cm gallstone in the gallbladder neck. No biliary duct
dilation. No pericholecystic stranding.

Pancreas: Normal intrinsic T1 signal in the pancreas. No sign of
peripancreatic inflammation or adjacent lesion.

Spleen:  Normal size and contour without focal lesion.

Adrenals/Urinary Tract:  Normal adrenal glands.

Symmetric renal enhancement. No hydronephrosis. No perinephric
stranding.

Stomach/Bowel: No acute gastrointestinal process. Study not
performed for bowel evaluation.

Vascular/Lymphatic: Vascular structures without dilation. No
adenopathy in the abdomen or in the pelvis.

Reproductive: IUD in situ. Uterus and adnexa otherwise unremarkable.

Other:  None

Musculoskeletal: No suspicious bone lesions identified.
IMPRESSION: 1. No suspicious lesion in the chest, abdomen or pelvis. No solid
organ lesion or adrenal abnormality.
2. Thyroid lesion better seen on MRI of the neck of the same date.
Ultrasound follow-up suggested for further evaluation.
3. Cholelithiasis with 1 cm gallstone in the gallbladder neck. No
biliary duct dilation.

## 2021-03-15 IMAGING — MR MR ABDOMEN WO/W CM
39 of 40 series · 47 of 48 positions shown · IV contrast (gadavist)
Comparison: None.

CLINICAL DATA: History of succinate dehydrogenase mutations
high-risk for pheochromocytoma and Peri ganglioma.

EXAM:
MRI CHEST, ABDOMEN AND PELVIS WITHOUT AND WITH CONTRAST
TECHNIQUE: Multiplanar multisequence MR imaging of the chest, abdomen and
pelvis was performed both before and after the administration of
intravenous contrast.
CONTRAST:  7mL GADAVIST GADOBUTROL 1 MMOL/ML IV SOLN

[Series 2: bSSFP · coronal · 6.0mm · 0.70mm/px · 1 of 40 slices shown (1 of 2)]
[im 1/40]
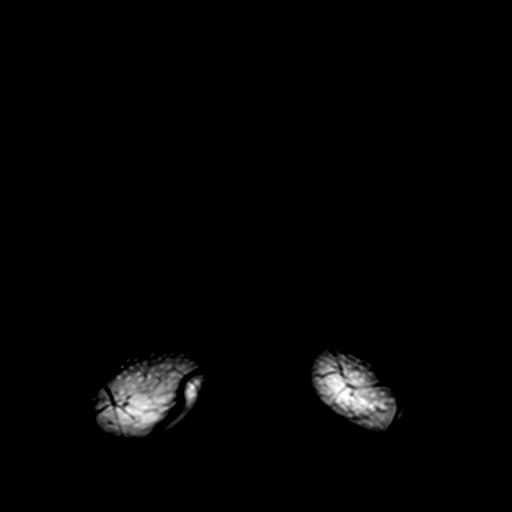

[Series 3: T1 · axial · 3.0mm · 1.25mm/px · 1 of 88 slices shown (1 of 5)]
[im 1/88]
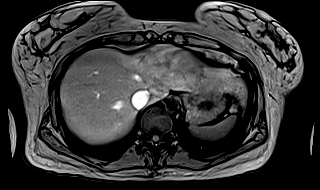

[Series 4: T1 · axial · 3.0mm · 1.25mm/px · 1 of 88 slices shown (2 of 5)]
[im 1/88]
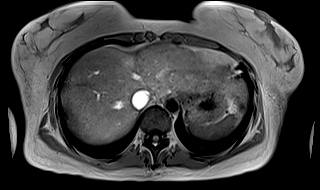

[Series 5: T2 · coronal · 6.0mm · 1.41mm/px · 1 of 40 slices shown (1 of 8)]
[im 1/40]
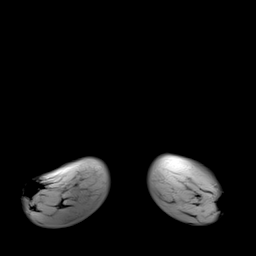

[Series 6: T2 fat-sat · coronal · 6.0mm · 1.41mm/px · 1 of 40 slices shown (1 of 4)]
[im 1/40]
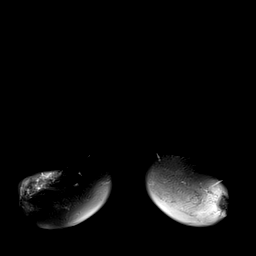

[Series 7: T2 fat-sat · axial · 6.0mm · 1.56mm/px · 1 of 40 slices shown (2 of 4)]
[im 1/40]
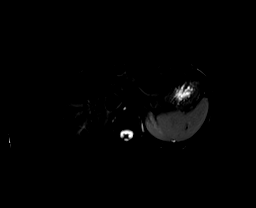

[Series 8: T2 · axial · 6.0mm · 1.56mm/px · 1 of 40 slices shown (2 of 8)]
[im 1/40]
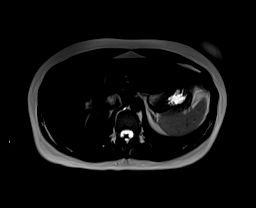

[Series 9: DWI · axial · 6.0mm · 1.42mm/px · 1 of 90 slices shown (1 of 6)]
[im 1/90]
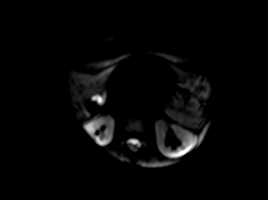

[Series 10: DWI · axial · 6.0mm · 1.42mm/px · 1 of 45 slices shown (2 of 6)]
[im 1/45]
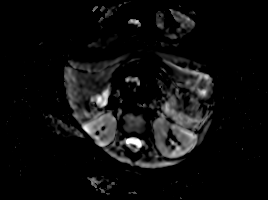

[Series 12: T1 dynamic · axial · 3.3mm · 1.06mm/px · 1 of 96 slices shown (1 of 12)]
[im 1/96]
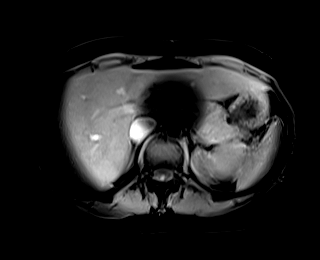

[Series 15: T2 · axial · 5.0mm · 0.51mm/px · 1 of 42 slices shown (3 of 8)]
[im 1/42]
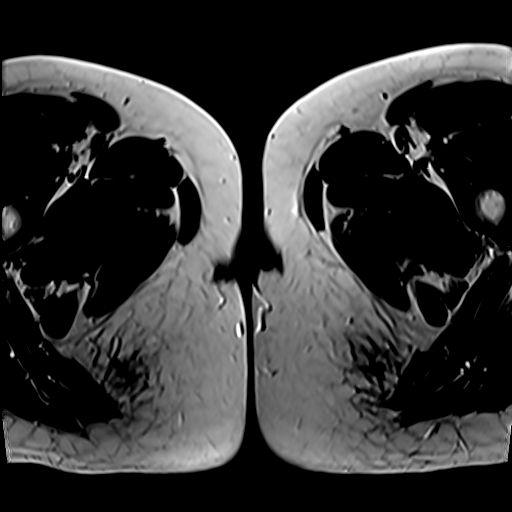

[Series 16: T2 · coronal · 6.0mm · 1.68mm/px · 1 of 30 slices shown (4 of 8)]
[im 1/30]
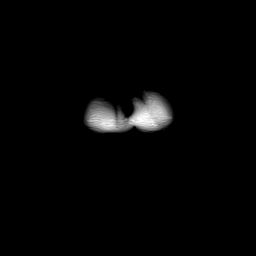

[Series 17: T2 · coronal · 6.0mm · 1.68mm/px · 1 of 30 slices shown (5 of 8)]
[im 1/30]
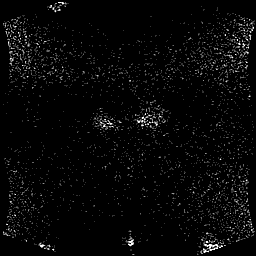

[Series 18: T2 · sagittal · 5.0mm · 0.55mm/px · 1 of 46 slices shown (6 of 8)]
[im 1/46]
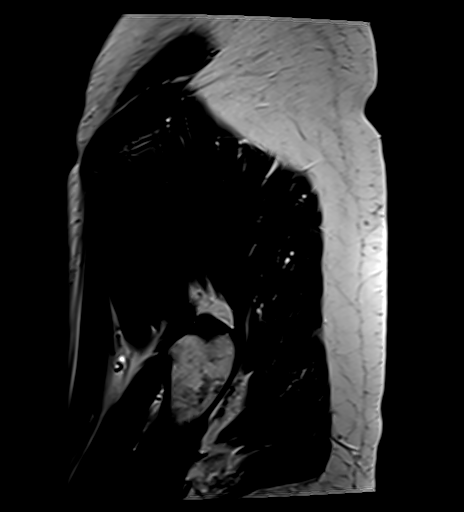

[Series 19: T1 · axial · 5.0mm · 0.51mm/px · 1 of 42 slices shown (3 of 5)]
[im 1/42]
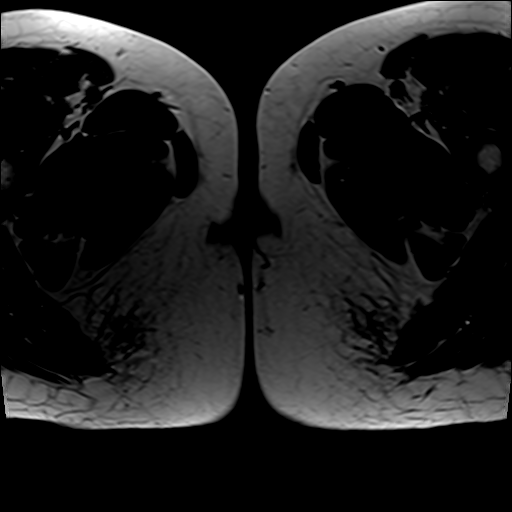

[Series 20: DWI · axial · 6.0mm · 1.36mm/px · 1 of 84 slices shown (3 of 6)]
[im 1/84]
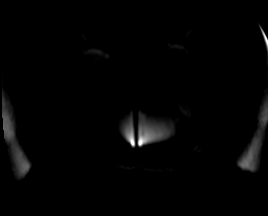

[Series 21: DWI · axial · 6.0mm · 1.36mm/px · 1 of 42 slices shown (4 of 6)]
[im 1/42]
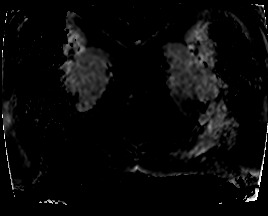

[Series 22: T1 fat-sat · axial · non-contrast · 5.0mm · 0.51mm/px · 1 of 46 slices shown]
[im 1/46]
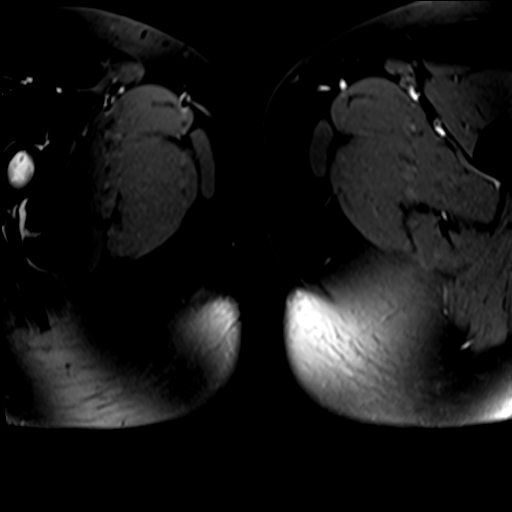

[Series 24: bSSFP · axial · 4.0mm · 0.80mm/px · 1 of 72 slices shown (2 of 2)]
[im 1/72]
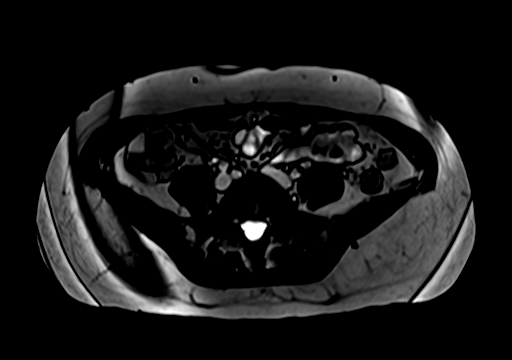

[Series 25: T2 fat-sat · axial · 4.0mm · 1.52mm/px · 1 of 68 slices shown (3 of 4)]
[im 1/68]
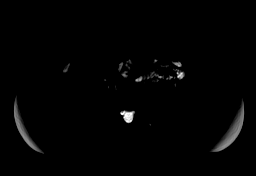

[Series 26: T2 · axial · 4.0mm · 1.52mm/px · 1 of 68 slices shown (7 of 8)]
[im 1/68]
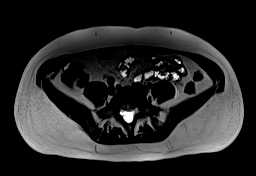

[Series 27: T1 · axial · 3.0mm · 1.25mm/px · 1 of 88 slices shown (4 of 5)]
[im 1/88]
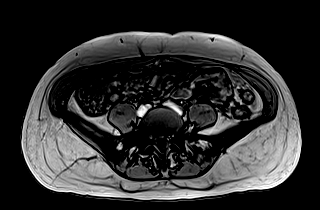

[Series 28: T1 · axial · 3.0mm · 1.25mm/px · 1 of 88 slices shown (5 of 5)]
[im 1/88]
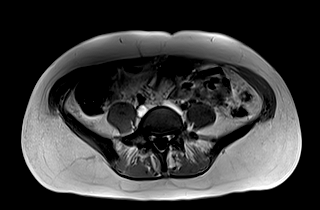

[Series 29: DWI · axial · 6.0mm · 1.42mm/px · 1 of 76 slices shown (5 of 6)]
[im 1/76]
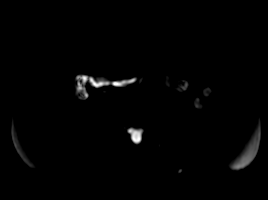

[Series 30: DWI · axial · 6.0mm · 1.42mm/px · 1 of 38 slices shown (6 of 6)]
[im 1/38]
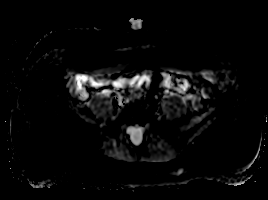

[Series 33: T2 fat-sat · axial · 6.0mm · 1.56mm/px · 1 of 37 slices shown (4 of 4)]
[im 1/37]
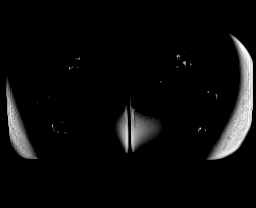

[Series 34: T2 · axial · 6.0mm · 1.56mm/px · 1 of 37 slices shown (8 of 8)]
[im 1/37]
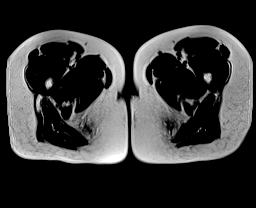

[Series 36: T1 dynamic · axial · 3.0mm · 1.25mm/px · 1 of 96 slices shown (2 of 12)]
[im 1/96]
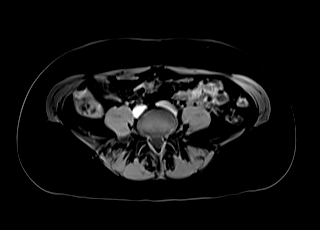

[Series 40: T1 dynamic · axial · 3.0mm · 1.25mm/px · 1 of 96 slices shown (3 of 12)]
[im 1/96]
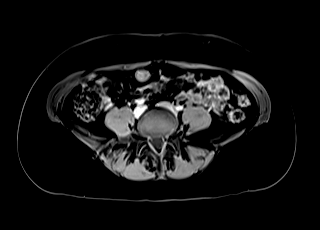

[Series 41: T1 dynamic · axial · 3.0mm · 1.25mm/px · 1 of 96 slices shown (4 of 12)]
[im 1/96]
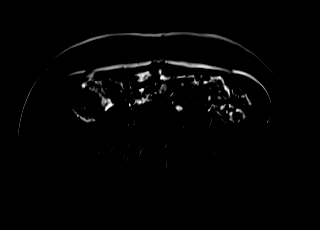

[Series 44: T1 dynamic · axial · 3.0mm · 1.25mm/px · z∈[-243,+42]mm · 2 of 96 slices shown (5 of 12)]
[im 1/96]
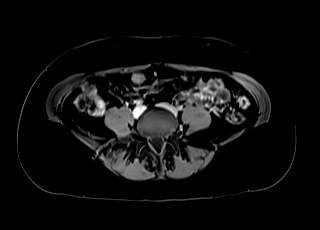
[im 96/96]
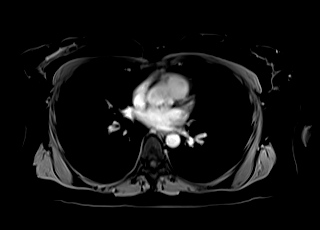

[Series 45: T1 dynamic · axial · 3.0mm · 1.25mm/px · z∈[-243,+42]mm · 2 of 96 slices shown (6 of 12)]
[im 1/96]
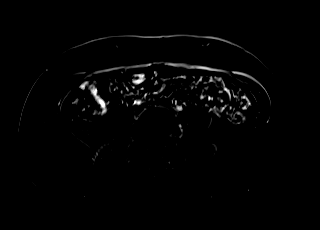
[im 96/96]
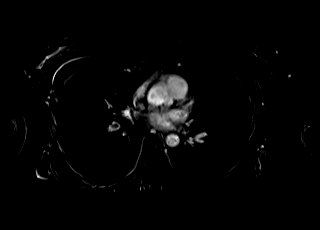

[Series 48: T1 dynamic · axial · 3.0mm · 1.25mm/px · z∈[-243,+42]mm · 2 of 96 slices shown (7 of 12)]
[im 1/96]
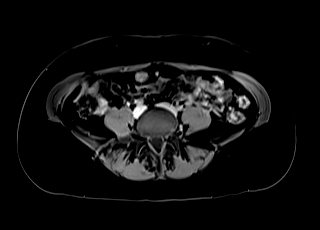
[im 96/96]
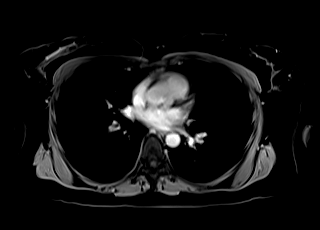

[Series 49: T1 dynamic · axial · 3.0mm · 1.25mm/px · z∈[-243,+42]mm · 2 of 96 slices shown (8 of 12)]
[im 1/96]
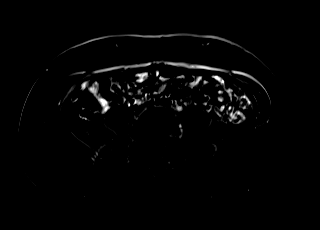
[im 96/96]
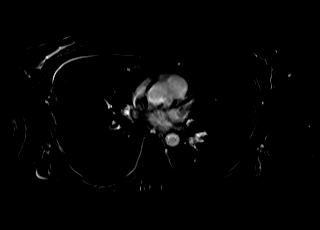

[Series 50: T1 fat-sat post-contrast · axial · 5.0mm · 0.51mm/px · 1 of 46 slices shown]
[im 1/46]
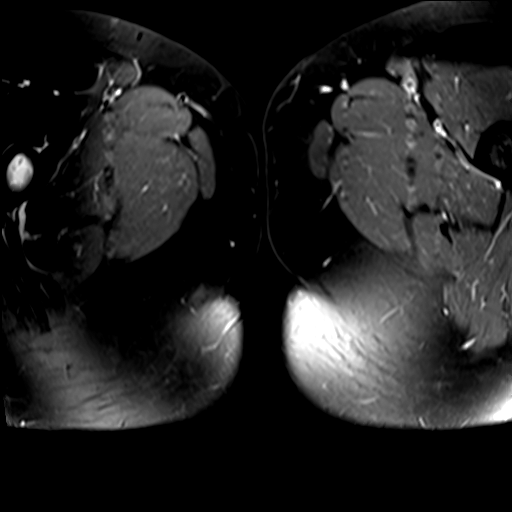

[Series 53: T1 dynamic · axial · 3.0mm · 1.25mm/px · z∈[-243,+42]mm · 2 of 96 slices shown (9 of 12)]
[im 1/96]
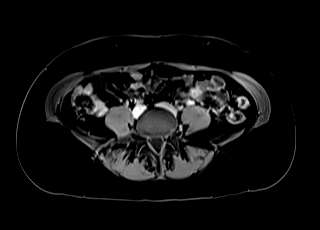
[im 96/96]
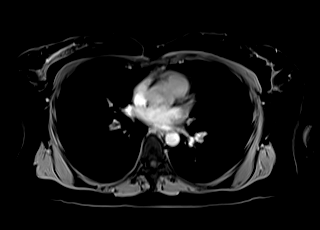

[Series 54: T1 dynamic · axial · 3.0mm · 1.25mm/px · z∈[-243,+42]mm · 2 of 96 slices shown (10 of 12)]
[im 1/96]
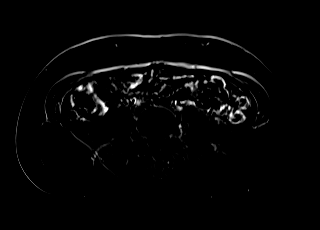
[im 96/96]
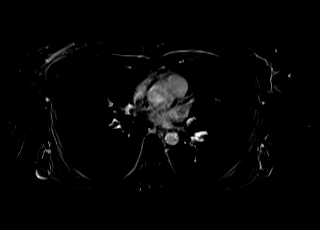

[Series 57: T1 dynamic · axial · 3.3mm · 1.06mm/px · z∈[-93,+221]mm · 2 of 96 slices shown (11 of 12)]
[im 1/96]
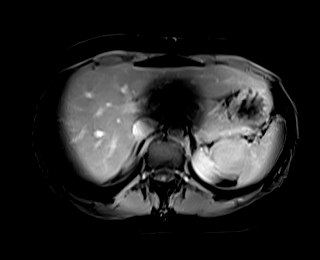
[im 96/96]
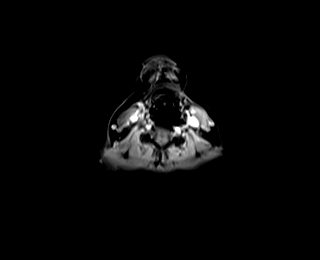

[Series 58: T1 dynamic · axial · 3.3mm · 1.06mm/px · z∈[-93,+221]mm · 2 of 96 slices shown (12 of 12)]
[im 1/96]
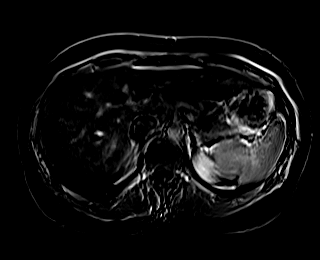
[im 96/96]
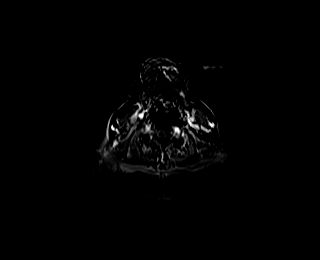

[47 of 48 positions shown; findings below may reference images not displayed]

FINDINGS: COMBINED FINDINGS FOR BOTH MR CHEST, ABDOMEN AND PELVIS

Cardiovascular: Normal heart size, no pericardial effusion. Normal
caliber of the thoracic aorta.

Mediastinum/Nodes: RIGHT thyroid lesion better seen on MRI of the
neck of the same date. No adenopathy in the chest. No perispinal
mass lesion. Grossly normal esophagus.

Lungs/Pleura: Lungs not well assessed on MRI. No gross
consolidation, effusion or visible lesion.

Musculoskeletal: No chest wall mass. No acute musculoskeletal
process or suspicious bone finding about the bony thorax.

Hepatobiliary: No focal, suspicious hepatic lesion. Cholelithiasis
with 1 cm gallstone in the gallbladder neck. No biliary duct
dilation. No pericholecystic stranding.

Pancreas: Normal intrinsic T1 signal in the pancreas. No sign of
peripancreatic inflammation or adjacent lesion.

Spleen:  Normal size and contour without focal lesion.

Adrenals/Urinary Tract:  Normal adrenal glands.

Symmetric renal enhancement. No hydronephrosis. No perinephric
stranding.

Stomach/Bowel: No acute gastrointestinal process. Study not
performed for bowel evaluation.

Vascular/Lymphatic: Vascular structures without dilation. No
adenopathy in the abdomen or in the pelvis.

Reproductive: IUD in situ. Uterus and adnexa otherwise unremarkable.

Other:  None

Musculoskeletal: No suspicious bone lesions identified.
IMPRESSION: 1. No suspicious lesion in the chest, abdomen or pelvis. No solid
organ lesion or adrenal abnormality.
2. Thyroid lesion better seen on MRI of the neck of the same date.
Ultrasound follow-up suggested for further evaluation.
3. Cholelithiasis with 1 cm gallstone in the gallbladder neck. No
biliary duct dilation.

## 2021-03-15 MED ORDER — GADOBUTROL 1 MMOL/ML IV SOLN
7.0000 mL | Freq: Once | INTRAVENOUS | Status: AC | PRN
  Administered 2021-03-15: 7 mL via INTRAVENOUS

## 2021-03-16 ENCOUNTER — Telehealth: Payer: Self-pay | Admitting: Internal Medicine

## 2021-03-16 DIAGNOSIS — E041 Nontoxic single thyroid nodule: Secondary | ICD-10-CM

## 2021-03-16 NOTE — Telephone Encounter (Signed)
Attempted to call the pt on 03/16/2021 at 9 AM . Left a voice mail to either call back or send a portal message     MRI results show right thyroid nodule, will proceed with thyroid ultrasound order   Rest of MRI unremarkable.    Abby Nena Jordan, MD  [facility]  [facility] [address] [place], [place] [address] Phone: [phone] FAX: [phone]

## 2021-04-01 ENCOUNTER — Ambulatory Visit
Admission: RE | Admit: 2021-04-01 | Discharge: 2021-04-01 | Disposition: A | Discharge status: Home / Self Care | Payer: Commercial Managed Care - PPO | Source: Ambulatory Visit | Attending: Internal Medicine | Admitting: Internal Medicine

## 2021-04-01 DIAGNOSIS — E041 Nontoxic single thyroid nodule: Secondary | ICD-10-CM

## 2021-04-01 IMAGING — US US THYROID
2 series · 13 of 25 positions shown · non-contrast
Comparison: None.

CLINICAL DATA: Incidentally noted right thyroid nodule on MRI

EXAM:
THYROID ULTRASOUND
TECHNIQUE: Ultrasound examination of the thyroid gland and adjacent soft
tissues was performed.

[Series 1: us thyroid · 0.07mm/px · 12 of 47 slices shown (1 of 2)]
[im 1/47]
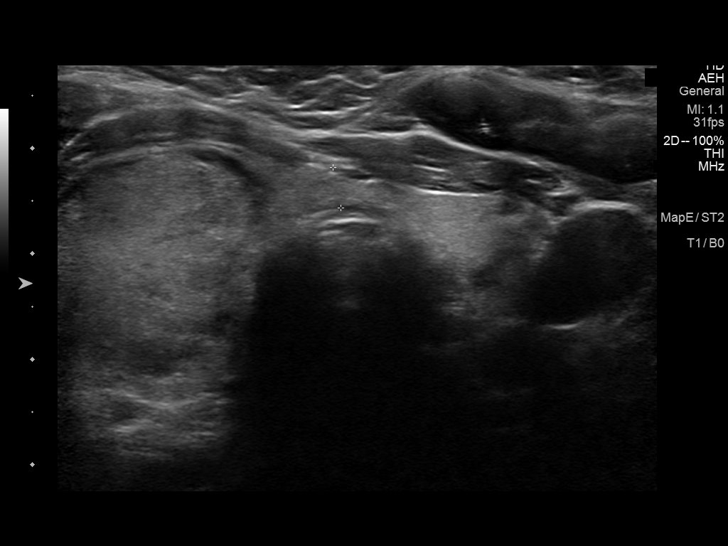
[im 5/47]
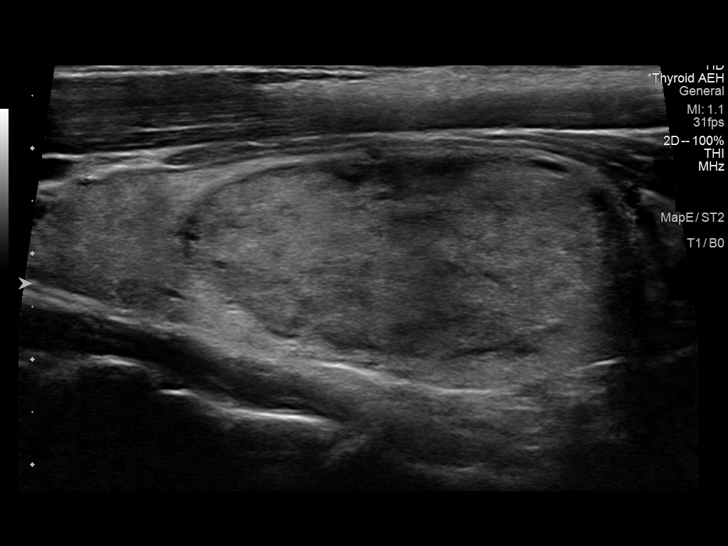
[im 9/47]
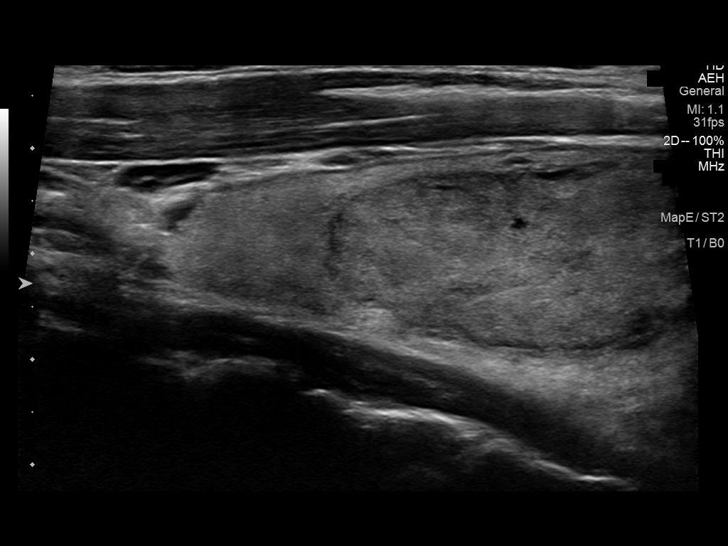
[im 13/47]
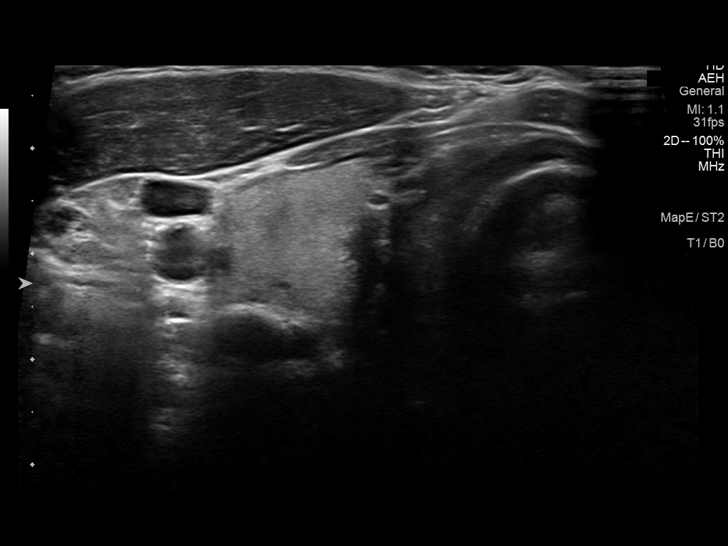
[im 17/47]
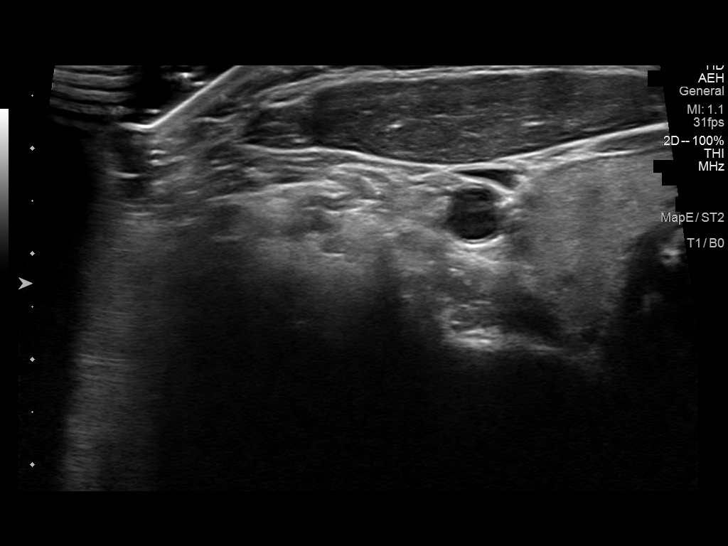
[im 21/47]
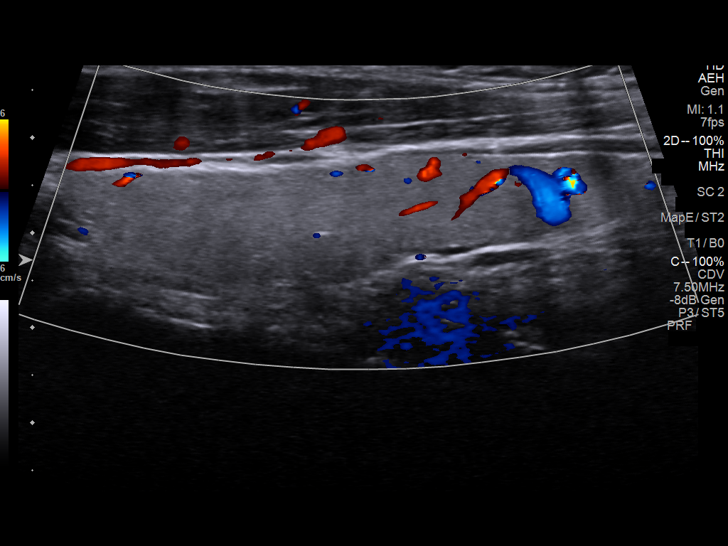
[im 25/47]
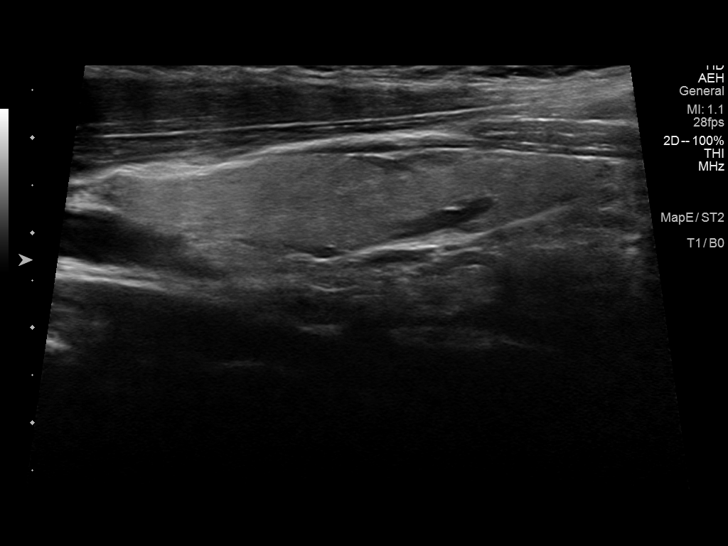
[im 29/47]
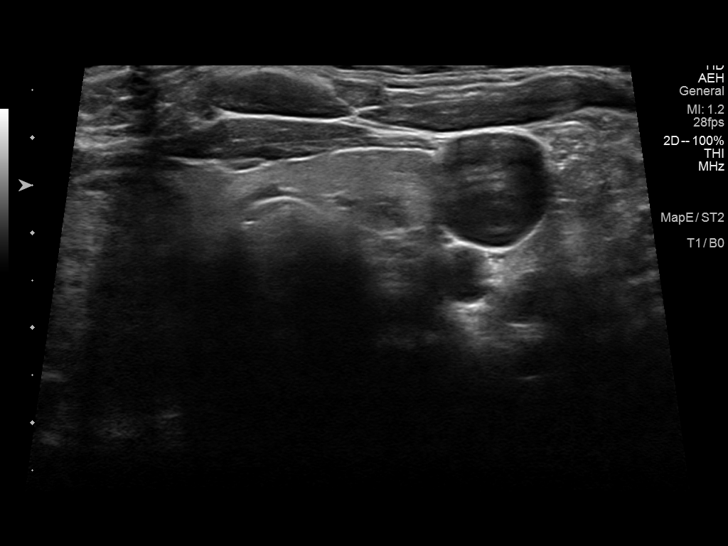
[im 33/47]
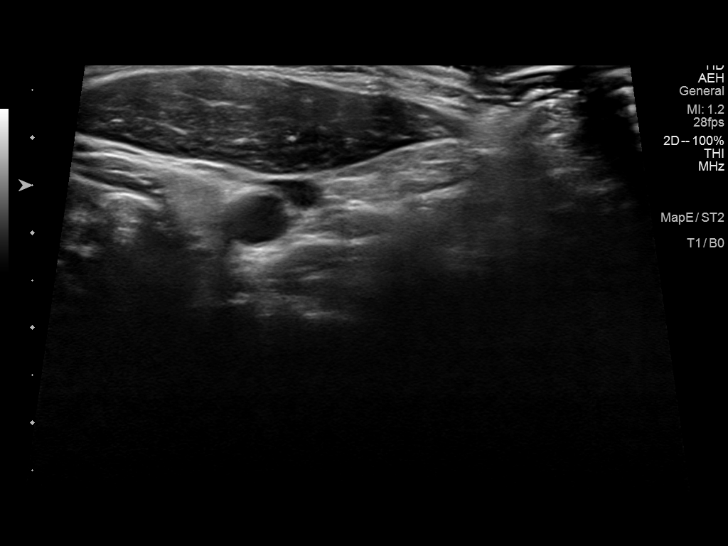
[im 37/47]
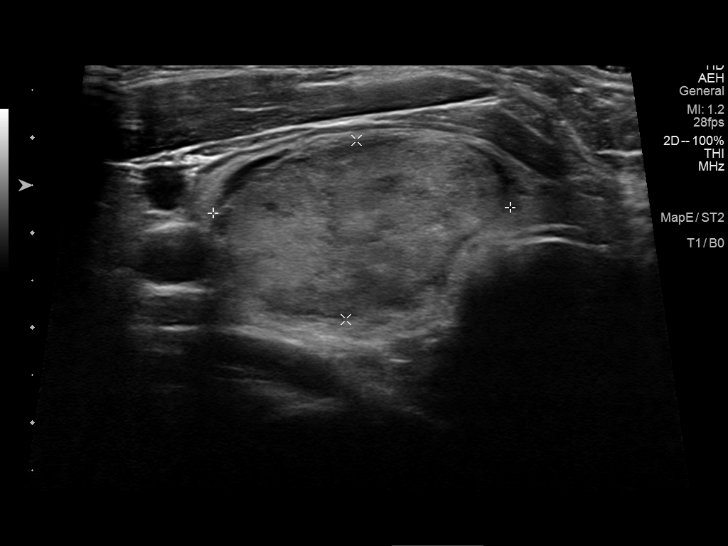
[im 41/47]
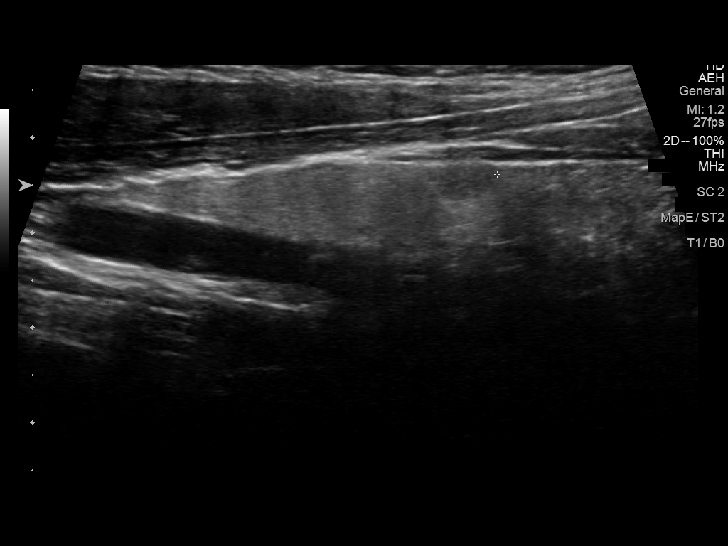
[im 45/47]
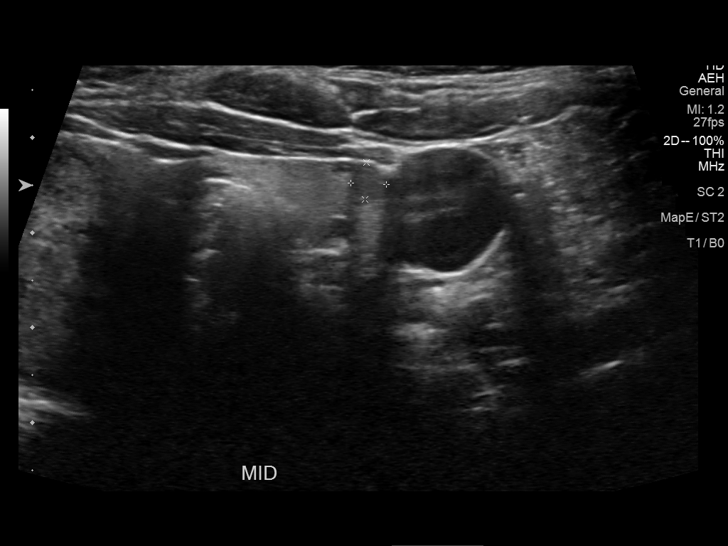

[Series 2001: us thyroid · 0.06mm/px · 1 of 1 slices shown (2 of 2)]
[im 1/1]
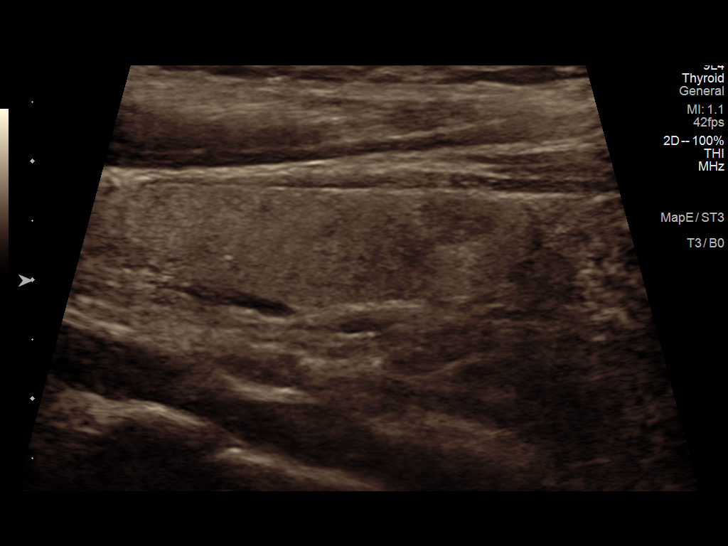

[13 of 25 positions shown; findings below may reference images not displayed]

FINDINGS: Parenchymal Echotexture: Mildly heterogeneous

Isthmus: 0.4 cm

Right lobe: 5.8 x 2.3 x 2.7 cm

Left lobe: 6.5 x 1.2 x 1.2 cm

_________________________________________________________

Estimated total number of nodules >/= 1 cm: 1

Number of spongiform nodules >/=  2 cm not described below (TR1): 0

Number of mixed cystic and solid nodules >/= 1.5 cm not described
below (TR2): 0

_________________________________________________________

Nodule # 1:

Location: Right; mid

Maximum size: 4.0 cm; Other 2 dimensions: 3.1 x 1.9 cm

Composition: solid/almost completely solid (2)

Echogenicity: isoechoic (1)

Shape: not taller-than-wide (0)

Margins: smooth (0)

Echogenic foci: none (0)

ACR TI-RADS total points: 3.

ACR TI-RADS risk category: TR3 (3 points).

ACR TI-RADS recommendations:

**Given size (>/= 2.5 cm) and appearance, fine needle aspiration of
this mildly suspicious nodule should be considered based on TI-RADS
criteria.

_________________________________________________________

Nodule # 2:

Location: Left; mid

Maximum size: 0.7 cm; Other 2 dimensions: 0.4 x 0.4 cm

Composition: solid/almost completely solid (2)

Echogenicity: hypoechoic (2)

Shape: not taller-than-wide (0)

Margins: smooth (0)

Echogenic foci: none (0)

ACR TI-RADS total points: 4.

ACR TI-RADS risk category: TR4 (4-6 points).

ACR TI-RADS recommendations:

Given size (<0.9 cm) and appearance, this nodule does NOT meet
TI-RADS criteria for biopsy or dedicated follow-up.

_________________________________________________________
IMPRESSION: Nodule 1 (TI-RADS 3) located in the mid right thyroid lobe meets
criteria for FNA.

The above is in keeping with the ACR TI-RADS recommendations - [HOSPITAL] [E2];[DATE].

## 2021-04-02 ENCOUNTER — Telehealth: Payer: Self-pay | Admitting: Internal Medicine

## 2021-04-02 DIAGNOSIS — E042 Nontoxic multinodular goiter: Secondary | ICD-10-CM

## 2021-04-02 NOTE — Telephone Encounter (Signed)
Discussed thyroid ultrasound results with the patient on 04/02/2021 at 1350  Patient in agreement for right thyroid nodule FNA  Orders have been entered  Thyroid ultrasound 04/01/2021  Nodule # 1:  Location: Right; mid  Maximum size: 4.0 cm; Other 2 dimensions: 3.1 x 1.9 cm  Composition: solid/almost completely solid (2)  Echogenicity: isoechoic (1)  Shape: not taller-than-wide (0)  Margins: smooth (0)  Echogenic foci: none (0)  ACR TI-RADS total points: 3.  ACR TI-RADS risk category: TR3 (3 points).  ACR TI-RADS recommendations:  **Given size (>/= 2.5 cm) and appearance, fine needle aspiration of this mildly suspicious nodule should be considered based on TI-RADS criteria.  _________________________________________________________  Nodule # 2:  Location: Left; mid  Maximum size: 0.7 cm; Other 2 dimensions: 0.4 x 0.4 cm  Composition: solid/almost completely solid (2)  Echogenicity: hypoechoic (2)  Shape: not taller-than-wide (0)  Margins: smooth (0)  Echogenic foci: none (0)  ACR TI-RADS total points: 4.  ACR TI-RADS risk category: TR4 (4-6 points).  ACR TI-RADS recommendations:  Given size (<0.9 cm) and appearance, this nodule does NOT meet TI-RADS criteria for biopsy or dedicated follow-up.  _________________________________________________________  IMPRESSION: Nodule 1 (TI-RADS 3) located in the mid right thyroid lobe meets criteria for FNA.

## 2021-04-08 ENCOUNTER — Other Ambulatory Visit: Payer: Self-pay

## 2021-04-08 ENCOUNTER — Ambulatory Visit
Admission: RE | Admit: 2021-04-08 | Discharge: 2021-04-08 | Disposition: A | Discharge status: Home / Self Care | Payer: Commercial Managed Care - PPO | Source: Ambulatory Visit | Attending: Internal Medicine | Admitting: Internal Medicine

## 2021-04-08 ENCOUNTER — Other Ambulatory Visit (HOSPITAL_COMMUNITY)
Admission: RE | Admit: 2021-04-08 | Discharge: 2021-04-08 | Disposition: A | Discharge status: Home / Self Care | Payer: Commercial Managed Care - PPO | Source: Ambulatory Visit | Attending: Internal Medicine | Admitting: Internal Medicine

## 2021-04-08 DIAGNOSIS — E042 Nontoxic multinodular goiter: Secondary | ICD-10-CM | POA: Diagnosis present

## 2021-04-08 IMAGING — US US FNA BIOPSY THYROID 1ST LESION
1 series · 13 of 19 positions shown · non-contrast
Comparison: US THYROID [DATE]

INDICATION: Indeterminate thyroid nodule of the right mid thyroid. Request is
made for fine-needle aspiration of indeterminate nodule.

EXAM:
ULTRASOUND GUIDED FINE NEEDLE ASPIRATION OF INDETERMINATE THYROID
NODULE
TECHNIQUE: Informed written consent was obtained from the patient after a
discussion of the risks, benefits and alternatives to treatment.
Questions regarding the procedure were encouraged and answered. A
timeout was performed prior to the initiation of the procedure.

[Series 1: us fna biopsy thyroid 1st lesion · 0.06mm/px · 19 acquisitions, 13 frames shown]
[im 1/19]
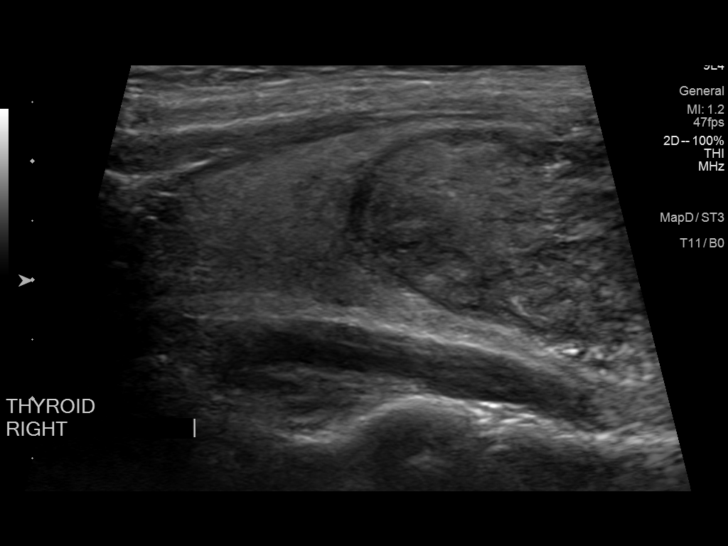
[im 3/19]
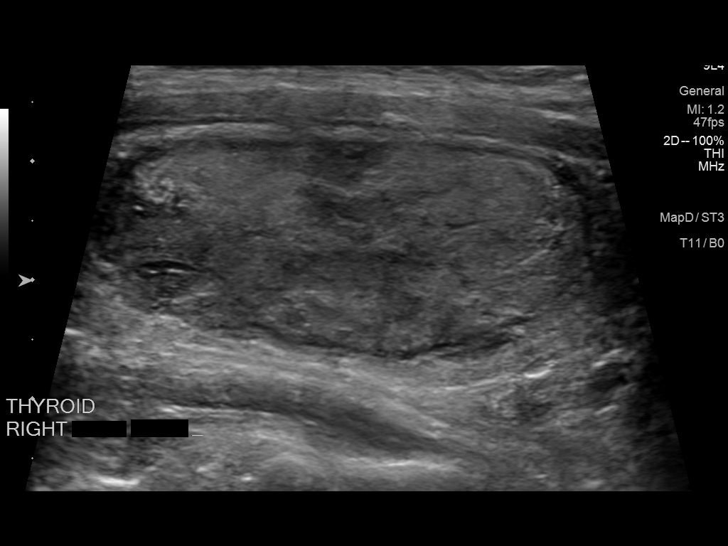
[im 4/19]
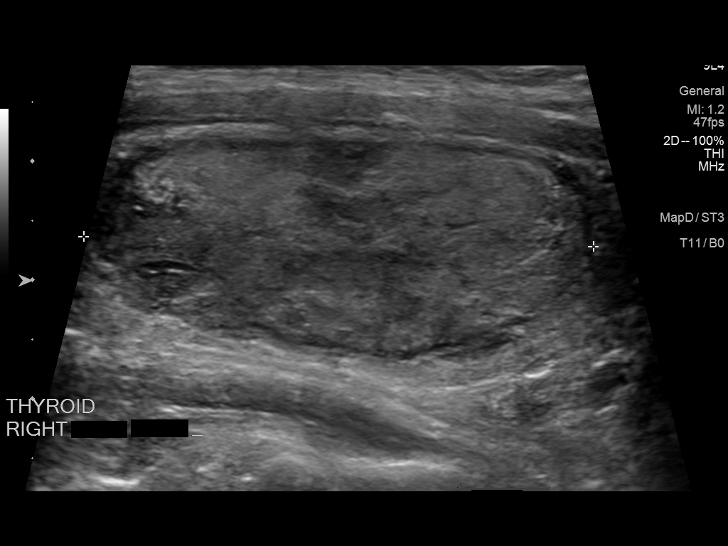
[im 6/19]
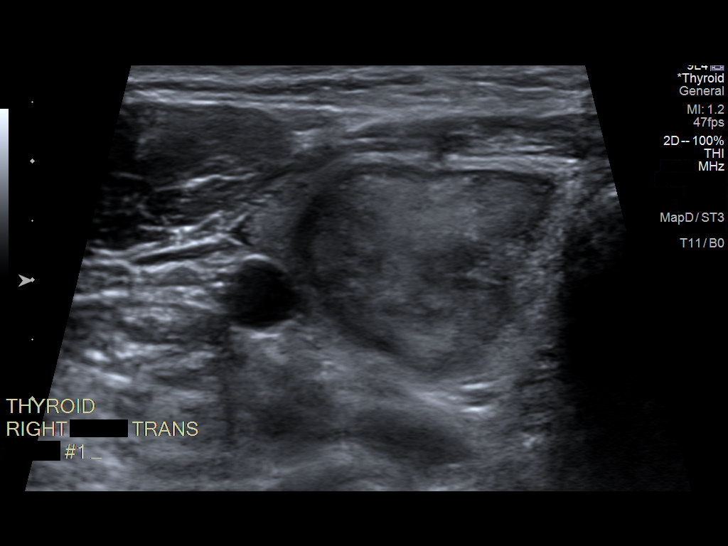
[im 7/19]
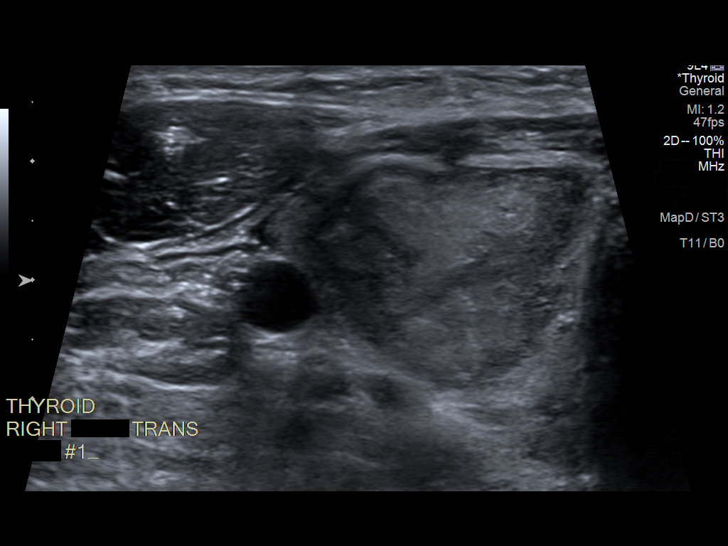
[im 9/19]
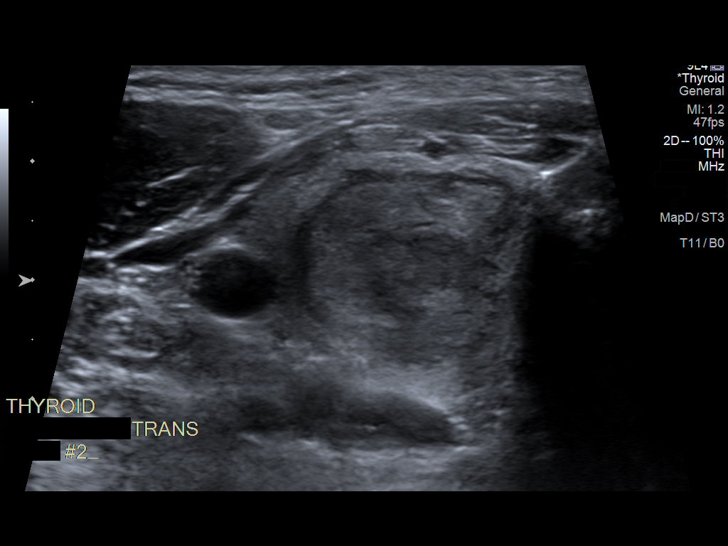
[im 10/19]
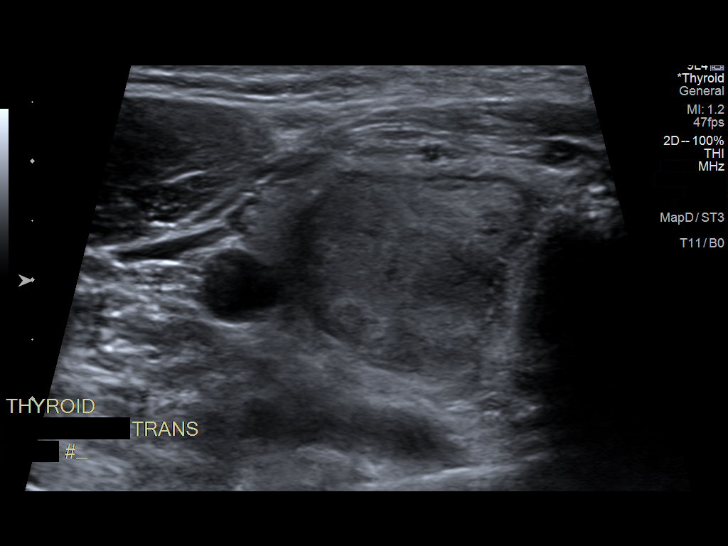
[im 11/19]
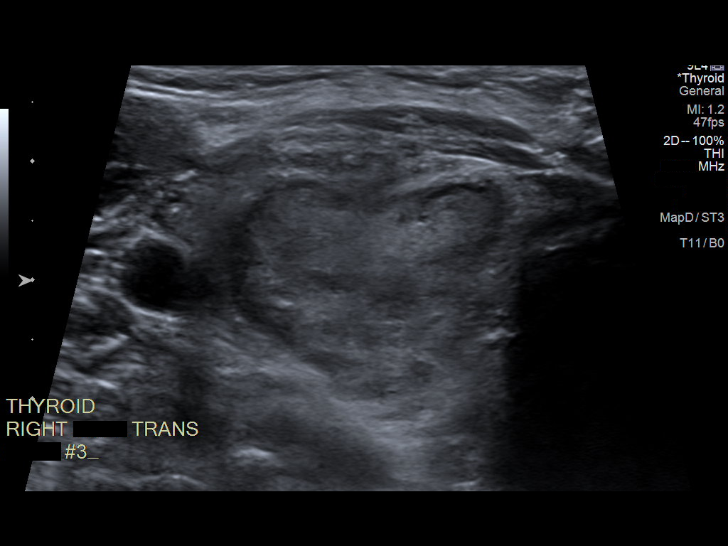
[im 13/19]
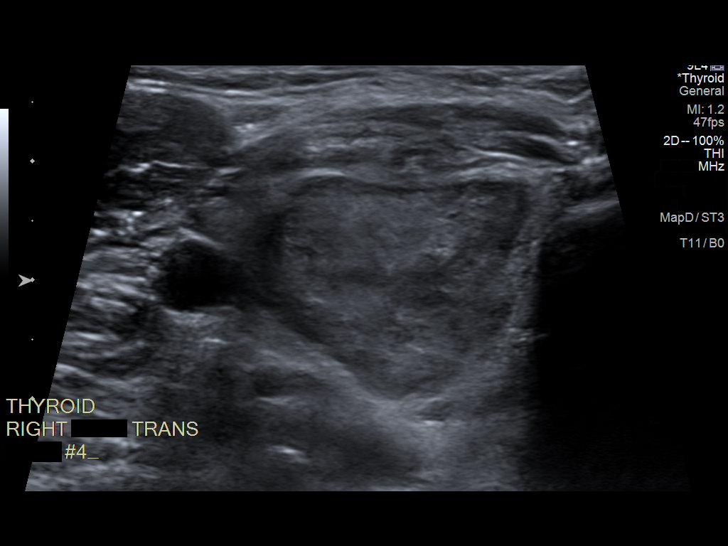
[im 14/19]
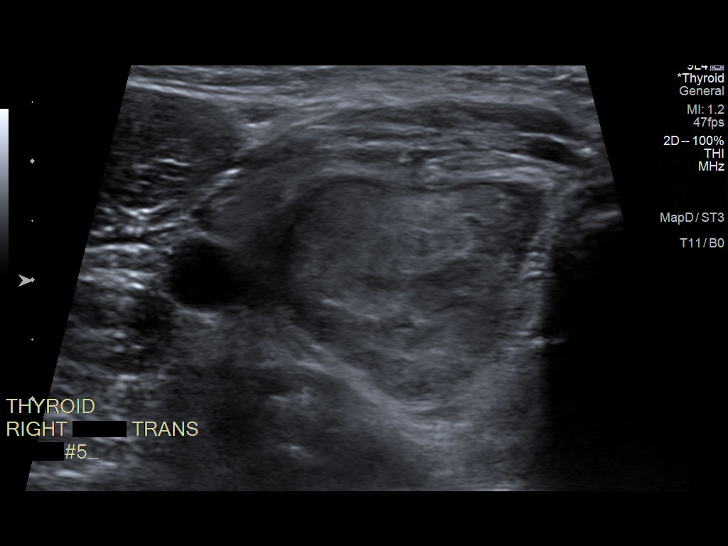
[im 16/19]
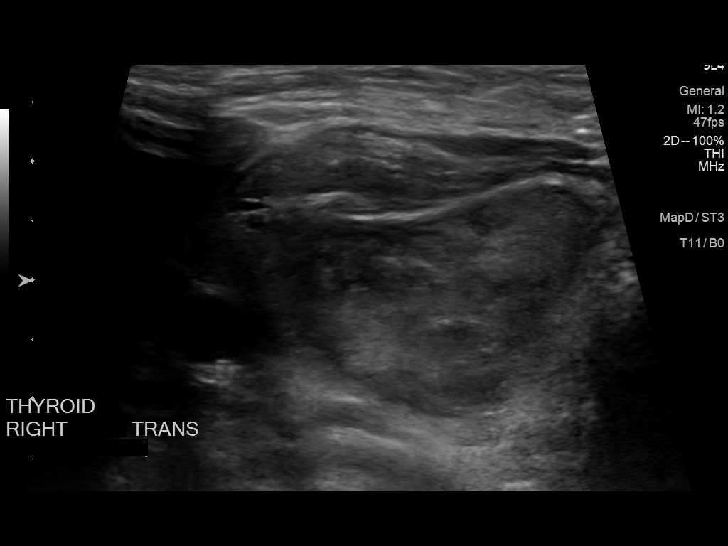
[im 17/19]
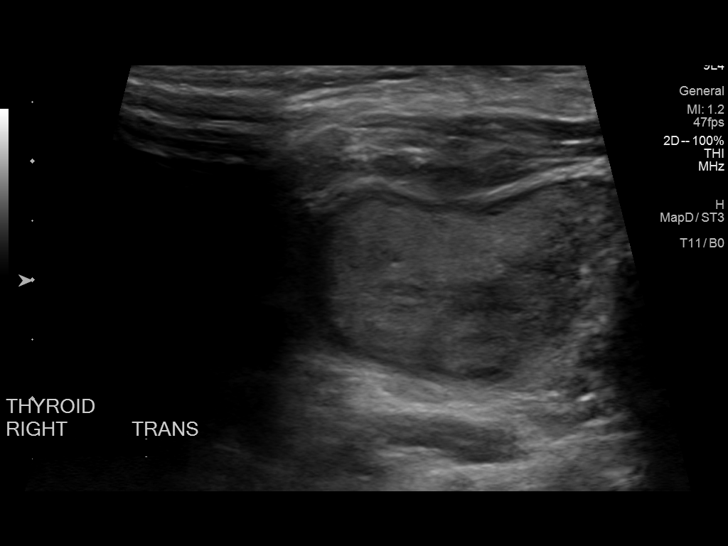
[im 19/19]
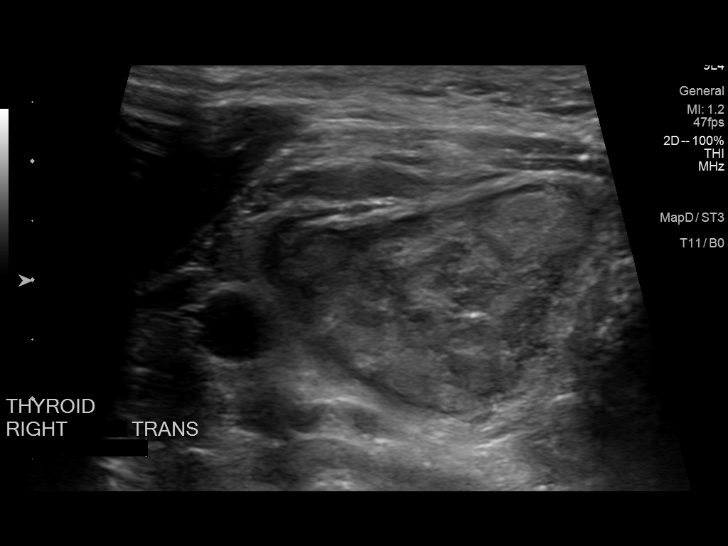

[13 of 19 positions shown; findings below may reference images not displayed]

MEDICATIONS:
2 mL 1% lidocaine

COMPLICATIONS:
SIR Level A - No therapy, no consequence.
Pre-procedural ultrasound scanning demonstrated unchanged size and
appearance of the indeterminate nodule within the right mid thyroid.

The procedure was planned. The neck was prepped in the usual sterile
fashion, and a sterile drape was applied covering the operative
field. A timeout was performed prior to the initiation of the
procedure. Local anesthesia was provided with 1% lidocaine.

Under direct ultrasound guidance, 5 FNA biopsies were performed of
the right mid thyroid nodule with a 27 gauge needle. Multiple
ultrasound images were saved for procedural documentation purposes.
The samples were prepared and submitted to pathology. Sample was
also prepared for Afirma testing.

Limited post procedural scanning was positive for small, stable
hematoma but no additional complication. Dressings were placed. The
patient tolerated the above procedures procedure well without
immediate postprocedural complication.
FINDINGS: Nodule reference number based on prior diagnostic ultrasound: 1

Maximum size: 4.0 cm

Location: Right; Mid

ACR TI-RADS risk category: TR3 (3 points)

Reason for biopsy: meets ACR TI-RADS criteria

Ultrasound imaging confirms appropriate placement of the needles
within the thyroid nodule.
IMPRESSION: Technically successful ultrasound guided fine needle aspiration of
right mid thyroid nodule.

## 2021-04-09 LAB — CYTOLOGY - NON PAP

## 2022-03-02 ENCOUNTER — Encounter: Payer: Self-pay | Admitting: Internal Medicine

## 2022-03-02 ENCOUNTER — Ambulatory Visit (INDEPENDENT_AMBULATORY_CARE_PROVIDER_SITE_OTHER): Payer: Commercial Managed Care - PPO | Admitting: Internal Medicine

## 2022-03-02 VITALS — BP 98/60 | HR 87 | Ht 68.0 in | Wt 160.2 lb

## 2022-03-02 DIAGNOSIS — R7989 Other specified abnormal findings of blood chemistry: Secondary | ICD-10-CM

## 2022-03-02 DIAGNOSIS — E042 Nontoxic multinodular goiter: Secondary | ICD-10-CM | POA: Diagnosis not present

## 2022-03-02 DIAGNOSIS — D447 Neoplasm of uncertain behavior of aortic body and other paraganglia: Secondary | ICD-10-CM | POA: Diagnosis not present

## 2022-03-02 NOTE — Progress Notes (Signed)
? ?Name: Taylor Pearson  ?MRN/ DOB: [id number], 09/07/1981    ?Age/ Sex: 41 y.o., female   ? ? ?PCP: Kathe Becton, DO   ?Reason for Endocrinology Evaluation: SDHC mutation  ?   ?Initial Endocrinology Clinic Visit: 01/23/2019  ? ? ?PATIENT IDENTIFIER: Taylor Pearson is a 41 y.o., female with unremarkable past medical history. She has followed with [facility] since 01/23/2019 for consultative assistance with management of her [facility] mutation. ? 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  ? ?No evidence of paraganglioma on MRI imaging 03/2021 ?So far biochemical testing has been negative ? ?Kids 5 and 49 yrs old  ?Great maternal uncle with paraganglioma  ? ? ? ?THYROID HISTORY: ?She had an incidental finding of right thyroid nodule on MRI during evaluation for paragangliomas in May 2022.  Thyroid ultrasound revealed a 4 cm right thyroid nodule meeting FNA criteria.  She is status post benign FNA of the nodule on 04/08/2021 ? ? ?SUBJECTIVE:  ? ? ?Today (03/02/2022):  Taylor Pearson is here for [facility] mutation.  ? ?Headaches: no ?HTN: no ?Palpitations: no ?Anxiety: no  ?Sweaty, Clammy sensations:  no  ?Abdominal Pain: no ?Nausea: nausea  ?No local neck symptoms  ? ? ? ? ?HISTORY:  ?Past Medical History:  ?Past Medical History:  ?Diagnosis Date  ? Family history of breast cancer   ? Family history of familial paraganglioma   ? Family history of lung cancer   ? Family history of prostate cancer   ? ?Past Surgical History:  ?Past Surgical History:  ?Procedure Laterality Date  ? TONSILLECTOMY    ? ?Social History:  reports that she has never smoked. She has never used smokeless tobacco. She reports that she does not currently use alcohol. She reports that she does not use drugs. ?Family History:  ?Family History  ?Problem Relation Age of Onset  ? Other Mother   ?     Positive for [facility] mutation    ? Breast cancer Maternal Aunt 59  ?     Genetic testing negative- VUS  ? Lung cancer Paternal Uncle   ? Prostate cancer Maternal Grandfather 54  ?     also had bile duct or lung cancer?  ? Bladder Cancer Maternal Grandfather   ? Other Paternal Grandmother   ?     might have had breast cancer >50  ? Breast cancer Maternal Aunt 69  ?     Genetic testing negative- VUS  ? Other Maternal Aunt   ?     SDHC+  ? Other Maternal Aunt   ?     SDHC+  ? Other Maternal Aunt   ?     tumor removed from neck  ? Other Cousin   ?     tumors removed from abdomen  ? Other Other 61  ?     carotid paraganglioma, SDHC+  ? ? ? ?HOME MEDICATIONS: ?Allergies as of 03/02/2022   ? ?   Reactions  ? Amoxicillin-pot Clavulanate Other (See Comments), Rash  ? Rash  ?Unknown  ? ?  ? ?  ?Medication List  ?  ? ?  ? Accurate as of March 02, 2022  3:33 PM. If you have any questions, ask your nurse or doctor.  ?  ?  ? ?  ? ?multivitamin capsule ?Take by mouth. ?  ? ?  ? ? ? ? ?  OBJECTIVE:  ? ?PHYSICAL EXAM: ?VS: BP 98/60   Pulse 87   Ht '5\' 8"'$  (1.727 m)   Wt 160 lb 3.2 oz (72.7 kg)   SpO2 99%   BMI 24.36 kg/m?   ? ?EXAM: ?General: Pt appears well and is in NAD  ?Neck: General: Supple without adenopathy. ?Thyroid: Right thyroid asymmetry   ?Lungs: Clear with good BS bilat with no rales, rhonchi, or wheezes  ?Heart: Auscultation: RRR.  ?Abdomen: Normoactive bowel sounds, soft, nontender, without masses or organomegaly palpable  ?Extremities:  ?BL LE: No pretibial edema normal ROM and strength.  ?Mental Status: Judgment, insight: Intact ?Orientation: Oriented to time, place, and person ?Mood and affect: No depression, anxiety, or agitation  ? ? ? ?DATA REVIEWED: ? ? Latest Reference Range & Units 03/02/22 15:52  ?Sodium 135 - 145 mEq/L 137  ?Potassium 3.5 - 5.1 mEq/L 4.1  ?Chloride 96 - 112 mEq/L 103  ?CO2 19 - 32 mEq/L 27  ?Glucose 70 - 99 mg/dL 75  ?BUN 6 - 23 mg/dL 16  ?Creatinine 0.40 - 1.20 mg/dL 0.66  ?Calcium 8.4 - 10.5 mg/dL 9.0  ?Alkaline  Phosphatase 39 - 117 U/L 43  ?Albumin 3.5 - 5.2 g/dL 4.4  ?AST 0 - 37 U/L 44 (H)  ?ALT 0 - 35 U/L 70 (H)  ?Total Protein 6.0 - 8.3 g/dL 7.0  ?Total Bilirubin 0.2 - 1.2 mg/dL 0.6  ?GFR >60.00 mL/min 109.96  ?WBC 4.0 - 10.5 K/uL 7.4  ?RBC 3.87 - 5.11 Mil/uL 4.75  ?Hemoglobin 12.0 - 15.0 g/dL 13.8  ?HCT 36.0 - 46.0 % 41.0  ?MCV 78.0 - 100.0 fl 86.3  ?MCHC 30.0 - 36.0 g/dL 33.7  ?RDW 11.5 - 15.5 % 12.9  ?Platelets 150.0 - 400.0 K/uL 265.0  ? ? Latest Reference Range & Units 03/02/22 15:52  ?TSH 0.35 - 5.50 uIU/mL 1.49  ?T4,Free(Direct) 0.60 - 1.60 ng/dL 0.83  ? ? ? ? ? ?MRI chest/abdomen/pelvis 03/15/2021 ?COMBINED FINDINGS FOR BOTH MR CHEST, ABDOMEN AND PELVIS ?  ?Cardiovascular: Normal heart size, no pericardial effusion. Normal ?caliber of the thoracic aorta. ?  ?Mediastinum/Nodes: RIGHT thyroid lesion better seen on MRI of the ?neck of the same date. No adenopathy in the chest. No perispinal ?mass lesion. Grossly normal esophagus. ?  ?Lungs/Pleura: Lungs not well assessed on MRI. No gross ?consolidation, effusion or visible lesion. ?  ?Musculoskeletal: No chest wall mass. No acute musculoskeletal ?process or suspicious bone finding about the bony thorax. ?  ?Hepatobiliary: No focal, suspicious hepatic lesion. Cholelithiasis ?with 1 cm gallstone in the gallbladder neck. No biliary duct ?dilation. No pericholecystic stranding. ?  ?Pancreas: Normal intrinsic T1 signal in the pancreas. No sign of ?peripancreatic inflammation or adjacent lesion. ?  ?Spleen:  Normal size and contour without focal lesion. ?  ?Adrenals/Urinary Tract:  Normal adrenal glands. ?  ?Symmetric renal enhancement. No hydronephrosis. No perinephric ?stranding. ?  ?Stomach/Bowel: No acute gastrointestinal process. Study not ?performed for bowel evaluation. ?  ?Vascular/Lymphatic: Vascular structures without dilation. No ?adenopathy in the abdomen or in the pelvis. ?  ?Reproductive: IUD in situ. Uterus and adnexa otherwise unremarkable. ?  ?Other:   None ?  ?Musculoskeletal: No suspicious bone lesions identified. ?  ?IMPRESSION: ?1. No suspicious lesion in the chest, abdomen or pelvis. No solid ?organ lesion or adrenal abnormality. ?2. Thyroid lesion better seen on MRI of the neck of the same date. ?Ultrasound follow-up suggested for further evaluation. ?3. Cholelithiasis with 1 cm gallstone in the gallbladder neck. No ?biliary  duct dilation. ?  ? ? ?FNA right mid nodule 04/08/2021 ?Clinical History: Right mid 4.0cm; Other 2 dimensions: 3.1 x 1.9cm,  ?Solid / almost completely solid, Isoechoic, TI-RADS total points 3  ?Specimen Submitted:  A. THYROID, RMP, FINE NEEDLE ASPIRATION:  ? ? ?FINAL MICROSCOPIC DIAGNOSIS:  ?- Consistent with benign follicular nodule (Bethesda category II)  ? ?ASSESSMENT / PLAN / RECOMMENDATIONS:  ? ?Succinate Dehydrogenase C [facility]) Mutation: ? ? ?- Head and neck paragangliomas are the most common tumor type seen in carriers of SDHC mutations. ?- SDHC mutations is an autosomal dominant with variable penetrance ?-There is no evidence of clinical catecholamine excess at this time, historically plasma catecholamines and metanephrines have been normal, will repeat again this year ?-We will consider 123 I-MIBG scintigraphy or an FDG-PET in the future, and this may be done every 5 years ?- BMP and CBC normal  ? ?2. Multinodular goiter: ? ? ?-No local neck symptoms ?-Patient is clinically euthyroid ?-She is status post benign FNA of the right 4 cm nodule ?-We will proceed with thyroid ultrasound to confirm stability ? ?3. Elevated LFT's : ? ?- This is new  ?- Will add GGT  ?-We will proceed with RUQ ultrasound, the patient had a gallstone on MRI imaging, she is asymptomatic but could be a cause ? ? ? ?F/U in 1 yr  ? ? ?Signed electronically by: ?Abby Nena Jordan, MD ? ?[facility]  ?[facility] ?[address] ?[place], [place] [address] ?Phone: [phone] ?FAX: [phone]  ? ? ? ? ?CC: ?Sigurd Sos  L, DO ?[place] ?[place] [address] ?Phone: [phone]  ?Fax: [phone] ? ? ?Return to Endocrinology clinic as below: ?No future appointments. ?  ? ?

## 2022-03-03 ENCOUNTER — Other Ambulatory Visit: Payer: Commercial Managed Care - PPO

## 2022-03-03 ENCOUNTER — Other Ambulatory Visit: Payer: Self-pay

## 2022-03-03 DIAGNOSIS — R7989 Other specified abnormal findings of blood chemistry: Secondary | ICD-10-CM

## 2022-03-03 LAB — T4, FREE: Free T4: 0.83 ng/dL (ref 0.60–1.60)

## 2022-03-03 LAB — COMPREHENSIVE METABOLIC PANEL
ALT: 70 U/L — ABNORMAL HIGH (ref 0–35)
AST: 44 U/L — ABNORMAL HIGH (ref 0–37)
Albumin: 4.4 g/dL (ref 3.5–5.2)
Alkaline Phosphatase: 43 U/L (ref 39–117)
BUN: 16 mg/dL (ref 6–23)
CO2: 27 mEq/L (ref 19–32)
Calcium: 9 mg/dL (ref 8.4–10.5)
Chloride: 103 mEq/L (ref 96–112)
Creatinine, Ser: 0.66 mg/dL (ref 0.40–1.20)
GFR: 109.96 mL/min (ref >60.00)
Glucose, Bld: 75 mg/dL (ref 70–99)
Potassium: 4.1 mEq/L (ref 3.5–5.1)
Sodium: 137 mEq/L (ref 135–145)
Total Bilirubin: 0.6 mg/dL (ref 0.2–1.2)
Total Protein: 7 g/dL (ref 6.0–8.3)

## 2022-03-03 LAB — CBC
HCT: 41 % (ref 36.0–46.0)
Hemoglobin: 13.8 g/dL (ref 12.0–15.0)
MCHC: 33.7 g/dL (ref 30.0–36.0)
MCV: 86.3 fl (ref 78.0–100.0)
Platelets: 265 10*3/uL (ref 150.0–400.0)
RBC: 4.75 Mil/uL (ref 3.87–5.11)
RDW: 12.9 % (ref 11.5–15.5)
WBC: 7.4 10*3/uL (ref 4.0–10.5)

## 2022-03-03 LAB — GAMMA GT: GGT: 29 U/L (ref 7–51)

## 2022-03-03 LAB — TSH: TSH: 1.49 u[IU]/mL (ref 0.35–5.50)

## 2022-03-10 ENCOUNTER — Other Ambulatory Visit: Payer: Self-pay

## 2022-03-16 ENCOUNTER — Other Ambulatory Visit: Payer: Commercial Managed Care - PPO

## 2022-03-29 ENCOUNTER — Inpatient Hospital Stay: Admission: RE | Admit: 2022-03-29 | Payer: Commercial Managed Care - PPO | Source: Ambulatory Visit

## 2022-04-25 ENCOUNTER — Ambulatory Visit
Admission: RE | Admit: 2022-04-25 | Discharge: 2022-04-25 | Disposition: A | Discharge status: Home / Self Care | Payer: Commercial Managed Care - PPO | Source: Ambulatory Visit | Attending: Internal Medicine | Admitting: Internal Medicine

## 2022-04-25 DIAGNOSIS — R7989 Other specified abnormal findings of blood chemistry: Secondary | ICD-10-CM

## 2022-04-25 DIAGNOSIS — E042 Nontoxic multinodular goiter: Secondary | ICD-10-CM

## 2022-04-25 IMAGING — US US THYROID
1 series · 13 of 25 positions shown · non-contrast
Comparison: [DATE]

Biopsy [DATE] right mid thyroid nodule 4.0 cm

CLINICAL DATA: 41-year-old female with a history multinodular
thyroid

EXAM:
THYROID ULTRASOUND
TECHNIQUE: Ultrasound examination of the thyroid gland and adjacent soft
tissues was performed.

[Series 1: us thyroid · 0.06mm/px · 13 of 54 slices shown]
[im 1/54]
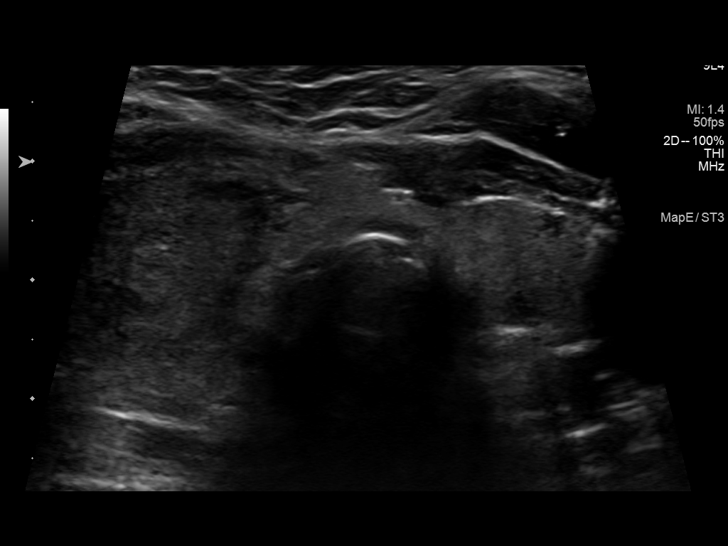
[im 5/54]
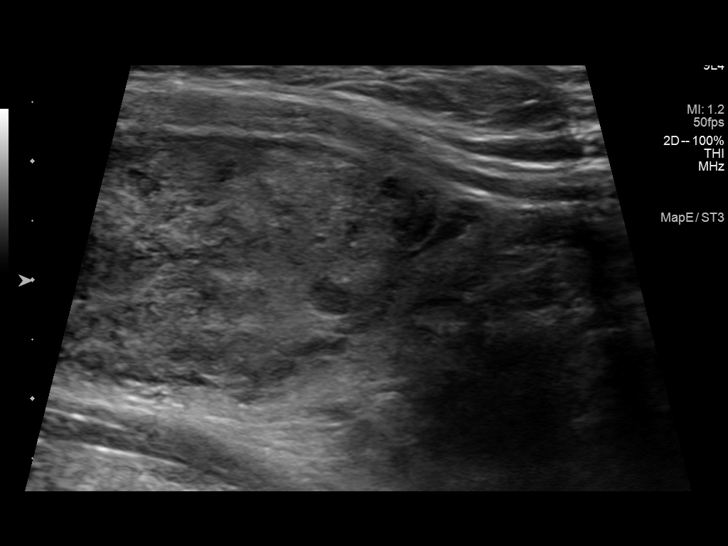
[im 9/54]
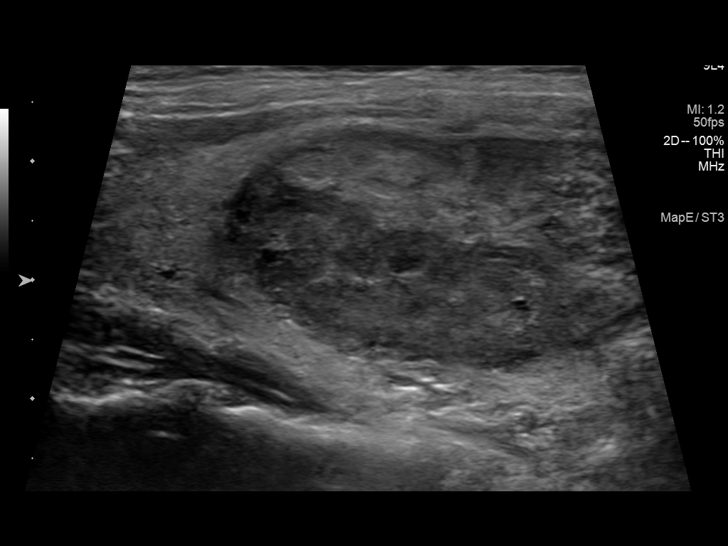
[im 14/54]
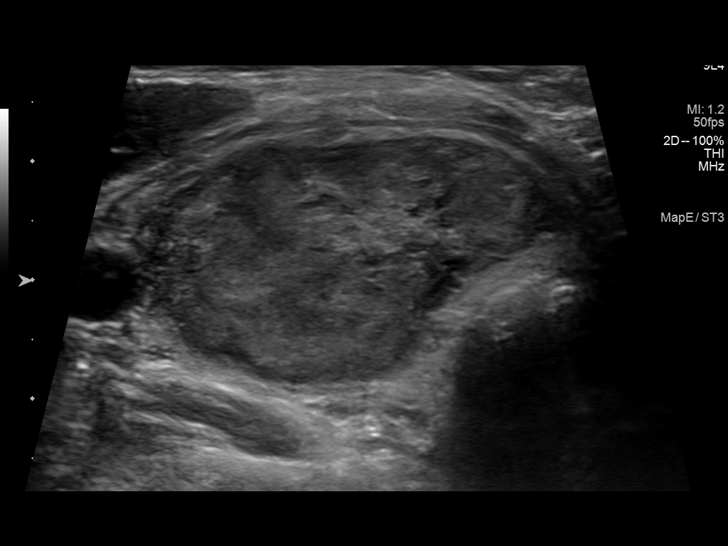
[im 18/54]
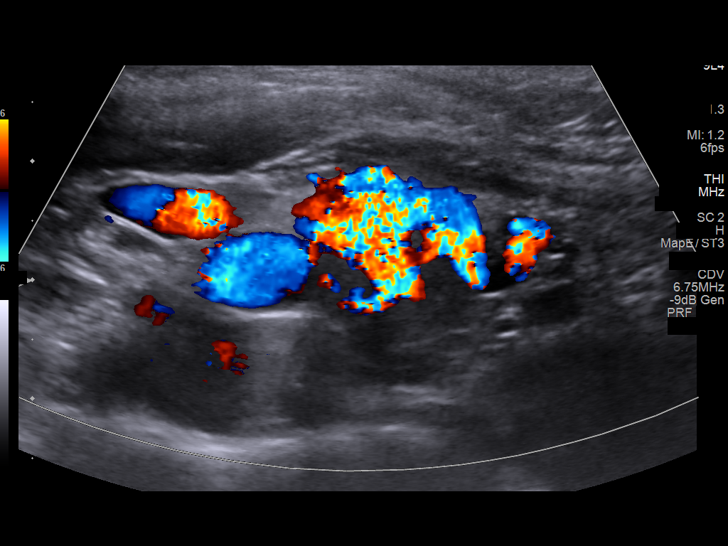
[im 23/54]
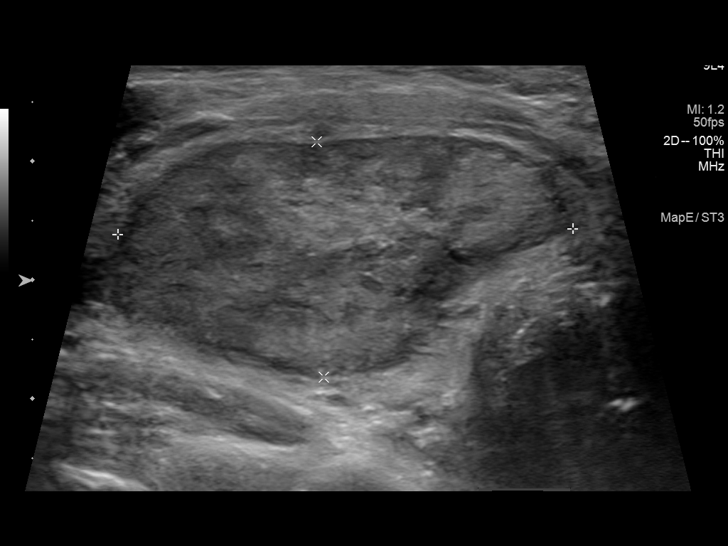
[im 27/54]
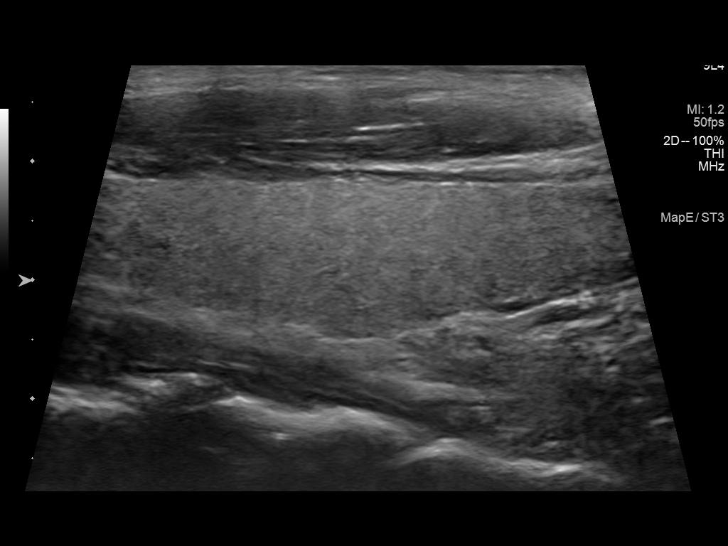
[im 31/54]
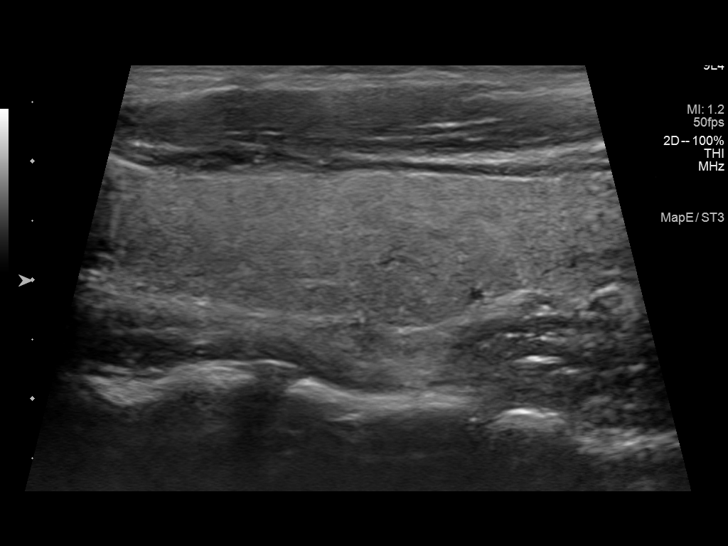
[im 36/54]
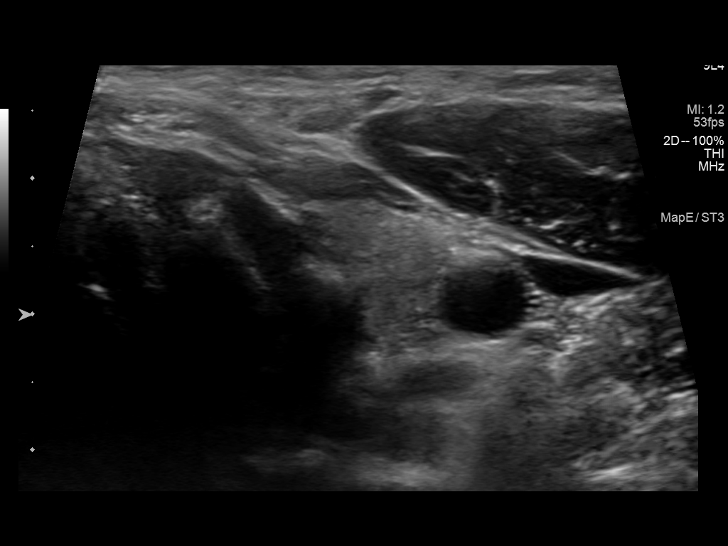
[im 40/54]
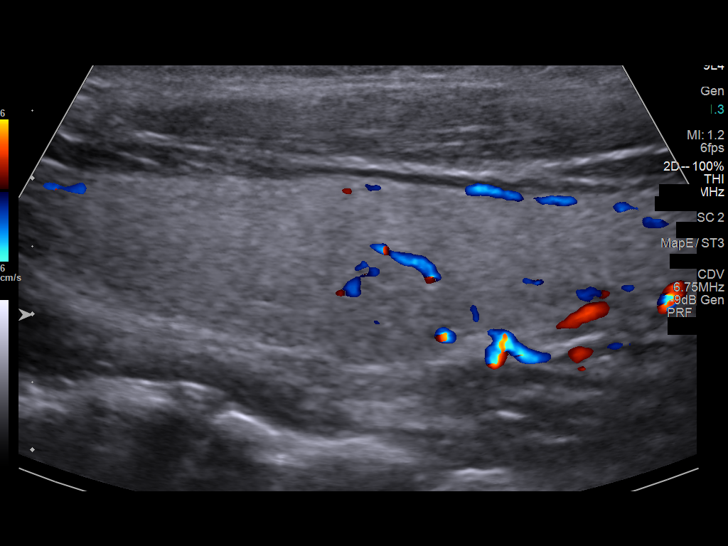
[im 45/54]
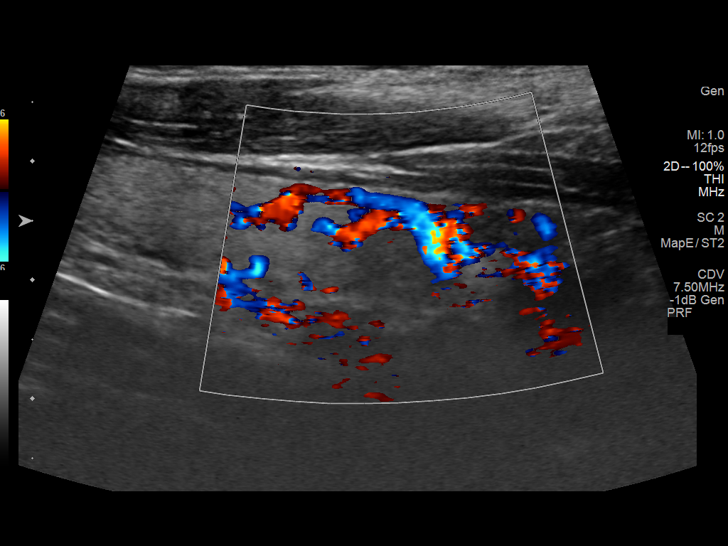
[im 49/54]
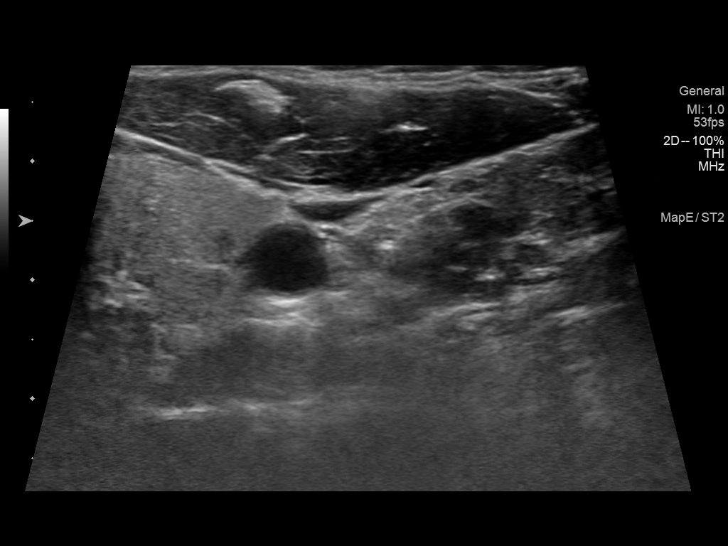
[im 54/54]
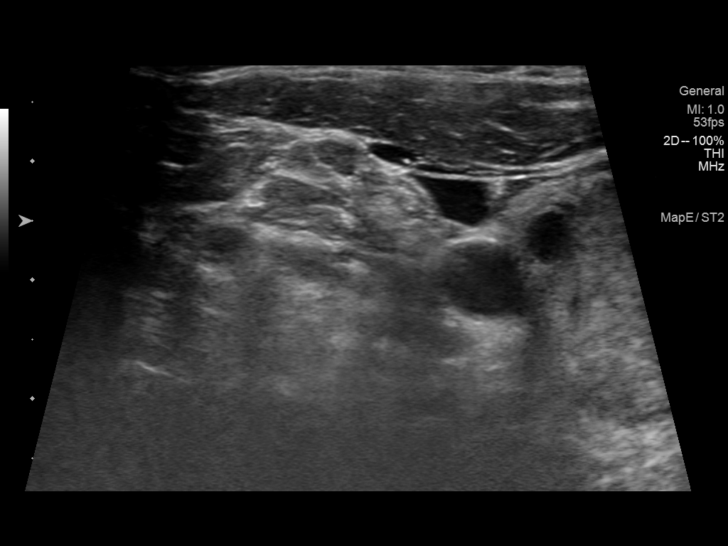

[13 of 25 positions shown; findings below may reference images not displayed]

FINDINGS: Parenchymal Echotexture: Mildly heterogenous

Isthmus: 0.3 cm

Right lobe: 0.4 cm x 2.5 cm x 3.7 cm

Left lobe: 6.6 cm x 1.4 cm x 1.5 cm

_________________________________________________________

Estimated total number of nodules >/= 1 cm: 1

Number of spongiform nodules >/=  2 cm not described below (TR1): 0

Number of mixed cystic and solid nodules >/= 1.5 cm not described
below (TR2): 0

_________________________________________________________

Nodule within the right thyroid, previously biopsied, 4.4 cm,
previously 4.3 cm. Assuming benign result, no further specific
follow-up would be indicated.

Pseudo nodule on the left, 6 mm. Nodule does not meet criteria for
surveillance.

No adenopathy

Recommendations follow those established by the new ACR TI-RADS
criteria ([HOSPITAL] [4M];[DATE]).
IMPRESSION: Heterogeneous thyroid suggesting medical thyroid disease.

Biopsy previously performed of the right thyroid nodule, as above.

## 2022-04-27 ENCOUNTER — Other Ambulatory Visit: Payer: Self-pay | Admitting: Internal Medicine

## 2022-04-27 DIAGNOSIS — R7989 Other specified abnormal findings of blood chemistry: Secondary | ICD-10-CM

## 2022-04-27 DIAGNOSIS — K8020 Calculus of gallbladder without cholecystitis without obstruction: Secondary | ICD-10-CM

## 2023-03-08 ENCOUNTER — Ambulatory Visit: Payer: Commercial Managed Care - PPO | Admitting: Internal Medicine

## 2023-03-17 LAB — METANEPHRINES, PLASMA
Metanephrine, Free: 29 pg/mL (ref <=57)
Normetanephrine, Free: 107 pg/mL (ref <=148)
Total Metanephrines-Plasma: 136 pg/mL (ref <=205)

## 2023-03-17 LAB — WILD CODE: PRICE:: 0

## 2023-03-17 LAB — CATECHOLAMINES, FRACTIONATED, PLASMA

## 2023-06-06 ENCOUNTER — Ambulatory Visit (INDEPENDENT_AMBULATORY_CARE_PROVIDER_SITE_OTHER): Payer: Commercial Managed Care - PPO | Admitting: Internal Medicine

## 2023-06-06 ENCOUNTER — Encounter: Payer: Self-pay | Admitting: Internal Medicine

## 2023-06-06 VITALS — BP 120/80 | HR 72 | Ht 68.0 in | Wt 170.6 lb

## 2023-06-06 DIAGNOSIS — K8020 Calculus of gallbladder without cholecystitis without obstruction: Secondary | ICD-10-CM

## 2023-06-06 DIAGNOSIS — E042 Nontoxic multinodular goiter: Secondary | ICD-10-CM

## 2023-06-06 DIAGNOSIS — D447 Neoplasm of uncertain behavior of aortic body and other paraganglia: Secondary | ICD-10-CM

## 2023-06-06 LAB — CBC
HCT: 42.2 % (ref 36.0–46.0)
Hemoglobin: 14 g/dL (ref 12.0–15.0)
MCHC: 33.1 g/dL (ref 30.0–36.0)
MCV: 86.9 fl (ref 78.0–100.0)
Platelets: 313 10*3/uL (ref 150.0–400.0)
RBC: 4.86 Mil/uL (ref 3.87–5.11)
RDW: 13 % (ref 11.5–15.5)
WBC: 9 10*3/uL (ref 4.0–10.5)

## 2023-06-06 LAB — COMPREHENSIVE METABOLIC PANEL
ALT: 25 U/L (ref 0–35)
AST: 23 U/L (ref 0–37)
Albumin: 4.4 g/dL (ref 3.5–5.2)
Alkaline Phosphatase: 50 U/L (ref 39–117)
BUN: 15 mg/dL (ref 6–23)
CO2: 25 mEq/L (ref 19–32)
Calcium: 9.8 mg/dL (ref 8.4–10.5)
Chloride: 103 mEq/L (ref 96–112)
Creatinine, Ser: 0.65 mg/dL (ref 0.40–1.20)
GFR: 109.4 mL/min (ref >60.00)
Glucose, Bld: 88 mg/dL (ref 70–99)
Potassium: 4.1 mEq/L (ref 3.5–5.1)
Sodium: 136 mEq/L (ref 135–145)
Total Bilirubin: 0.5 mg/dL (ref 0.2–1.2)
Total Protein: 7.4 g/dL (ref 6.0–8.3)

## 2023-06-06 LAB — TSH: TSH: 1.53 u[IU]/mL (ref 0.35–5.50)

## 2023-06-06 LAB — T4, FREE: Free T4: 0.64 ng/dL (ref 0.60–1.60)

## 2023-06-06 NOTE — Progress Notes (Unsigned)
Name: Taylor Pearson  MRN/ DOB: [id number], 1981-03-16    Age/ Sex: 42 y.o., female     PCP: Laurena Bering, DO   Reason for Endocrinology Evaluation: [facility] mutation     Initial Endocrinology Clinic Visit: 01/23/2019    PATIENT IDENTIFIER: Taylor Pearson is a 42 y.o., female with unremarkable past medical history. She has followed with [facility] since 01/23/2019 for consultative assistance with management of her [facility] mutation.  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   No evidence of paraganglioma on MRI imaging 03/2021 So far biochemical testing has been negative  Kids 5 and 26 yrs old  [place] maternal uncle with paraganglioma     THYROID HISTORY: She had an incidental finding of right thyroid nodule on MRI during evaluation for paragangliomas in May 2022.  Thyroid ultrasound revealed a 4 cm right thyroid nodule meeting FNA criteria.  She is status post benign FNA of the nodule on 04/08/2021   SUBJECTIVE:    Today (06/06/2023):  Taylor Pearson is here for [facility] mutation.   Headaches: no HTN: no Palpitations: no Anxiety: no  Sweaty, Clammy sensations:  no  Abdominal Pain: no Nausea: nausea  No local neck symptoms    She has not seen the general surgeon  Denies abdominal pain, cramps nor nausea  HISTORY:  Past Medical History:  Past Medical History:  Diagnosis Date   Family history of breast cancer    Family history of familial paraganglioma    Family history of lung cancer    Family history of prostate cancer    Past Surgical History:  Past Surgical History:  Procedure Laterality Date   TONSILLECTOMY     Social History:  reports that she has never smoked. She has never used smokeless tobacco. She reports that she does not currently use alcohol. She reports that she does not use drugs. Family History:  Family History   Problem Relation Age of Onset   Other Mother        Positive for [facility] mutation    Breast cancer Maternal Aunt 2       Genetic testing negative- VUS   Lung cancer Paternal Uncle    Prostate cancer Maternal Grandfather 46       also had bile duct or lung cancer?   Bladder Cancer Maternal Grandfather    Other Paternal Grandmother        might have had breast cancer >50   Breast cancer Maternal Aunt 69       Genetic testing negative- VUS   Other Maternal Aunt        SDHC+   Other Maternal Aunt        SDHC+   Other Maternal Aunt        tumor removed from neck   Other Cousin        tumors removed from abdomen   Other Other 61       carotid paraganglioma, [facility]     HOME MEDICATIONS: Allergies as of 06/06/2023       Reactions   Amoxicillin-pot Clavulanate Other (See Comments), Rash   Rash  Unknown        Medication List        Accurate as of June 06, 2023  7:13 AM. If you have any questions, ask your nurse or doctor.          multivitamin  capsule Take by mouth.          OBJECTIVE:   PHYSICAL EXAM: VS: There were no vitals taken for this visit.   EXAM: General: Pt appears well and is in NAD  Neck: General: Supple without adenopathy. Thyroid: Right thyroid asymmetry   Lungs: Clear with good BS bilat with no rales, rhonchi, or wheezes  Heart: Auscultation: RRR.  Abdomen: Normoactive bowel sounds, soft, nontender, without masses or organomegaly palpable  Extremities:  BL LE: No pretibial edema normal ROM and strength.  Mental Status: Judgment, insight: Intact Orientation: Oriented to time, place, and person Mood and affect: No depression, anxiety, or agitation     DATA REVIEWED:   Latest Reference Range & Units 06/06/23 11:12  Sodium 135 - 145 mEq/L 136  Potassium 3.5 - 5.1 mEq/L 4.1  Chloride 96 - 112 mEq/L 103  CO2 19 - 32 mEq/L 25  Glucose 70 - 99 mg/dL 88  BUN 6 - 23 mg/dL 15  Creatinine 7.10 - 6.26 mg/dL 9.48  Calcium 8.4 - 54.6 mg/dL  9.8  Alkaline Phosphatase 39 - 117 U/L 50  Albumin 3.5 - 5.2 g/dL 4.4  AST 0 - 37 U/L 23  ALT 0 - 35 U/L 25  Total Protein 6.0 - 8.3 g/dL 7.4  Total Bilirubin 0.2 - 1.2 mg/dL 0.5  GFR >27.03 mL/min 109.40  WBC 4.0 - 10.5 K/uL 9.0  RBC 3.87 - 5.11 Mil/uL 4.86  Hemoglobin 12.0 - 15.0 g/dL 50.0  HCT 93.8 - 18.2 % 42.2  MCV 78.0 - 100.0 fl 86.9  MCHC 30.0 - 36.0 g/dL 99.3  RDW 71.6 - 96.7 % 13.0  Platelets 150.0 - 400.0 K/uL 313.0    Latest Reference Range & Units 06/06/23 11:12  TSH 0.35 - 5.50 uIU/mL 1.53  T4,Free(Direct) 0.60 - 1.60 ng/dL 8.93      MRI chest/abdomen/pelvis 03/15/2021 COMBINED FINDINGS FOR BOTH MR CHEST, ABDOMEN AND PELVIS   Cardiovascular: Normal heart size, no pericardial effusion. Normal caliber of the thoracic aorta.   Mediastinum/Nodes: RIGHT thyroid lesion better seen on MRI of the neck of the same date. No adenopathy in the chest. No perispinal mass lesion. Grossly normal esophagus.   Lungs/Pleura: Lungs not well assessed on MRI. No gross consolidation, effusion or visible lesion.   Musculoskeletal: No chest wall mass. No acute musculoskeletal process or suspicious bone finding about the bony thorax.   Hepatobiliary: No focal, suspicious hepatic lesion. Cholelithiasis with 1 cm gallstone in the gallbladder neck. No biliary duct dilation. No pericholecystic stranding.   Pancreas: Normal intrinsic T1 signal in the pancreas. No sign of peripancreatic inflammation or adjacent lesion.   Spleen:  Normal size and contour without focal lesion.   Adrenals/Urinary Tract:  Normal adrenal glands.   Symmetric renal enhancement. No hydronephrosis. No perinephric stranding.   Stomach/Bowel: No acute gastrointestinal process. Study not performed for bowel evaluation.   Vascular/Lymphatic: Vascular structures without dilation. No adenopathy in the abdomen or in the pelvis.   Reproductive: IUD in situ. Uterus and adnexa otherwise unremarkable.   Other:   None   Musculoskeletal: No suspicious bone lesions identified.   IMPRESSION: 1. No suspicious lesion in the chest, abdomen or pelvis. No solid organ lesion or adrenal abnormality. 2. Thyroid lesion better seen on MRI of the neck of the same date. Ultrasound follow-up suggested for further evaluation. 3. Cholelithiasis with 1 cm gallstone in the gallbladder neck. No biliary duct dilation.     Thyroid Ultrasound 04/25/2022  Parenchymal Echotexture: Mildly heterogenous  Isthmus: 0.3 cm   Right lobe: 0.4 cm x 2.5 cm x 3.7 cm   Left lobe: 6.6 cm x 1.4 cm x 1.5 cm   _________________________________________________________   Estimated total number of nodules >/= 1 cm: 1   Number of spongiform nodules >/=  2 cm not described below (TR1): 0   Number of mixed cystic and solid nodules >/= 1.5 cm not described below (TR2): 0   _________________________________________________________   Nodule within the right thyroid, previously biopsied, 4.4 cm, previously 4.3 cm. Assuming benign result, no further specific follow-up would be indicated.   Pseudo nodule on the left, 6 mm. Nodule does not meet criteria for surveillance.   No adenopathy   Recommendations follow those established by the new ACR TI-RADS criteria (J Am Coll Radiol 2017;14:587-595).   IMPRESSION: Heterogeneous thyroid suggesting medical thyroid disease.   Biopsy previously performed of the right thyroid nodule, as above.    FNA right mid nodule 04/08/2021 Clinical History: Right mid 4.0cm; Other 2 dimensions: 3.1 x 1.9cm,  Solid / almost completely solid, Isoechoic, TI-RADS total points 3  Specimen Submitted:  A. THYROID, RMP, FINE NEEDLE ASPIRATION:    FINAL MICROSCOPIC DIAGNOSIS:  - Consistent with benign follicular nodule (Bethesda category II)   ASSESSMENT / PLAN / RECOMMENDATIONS:   Succinate Dehydrogenase C (SDHC) Mutation:   - Head and neck paragangliomas are the most common tumor type seen in  carriers of SDHC mutations. - SDHC mutations is an autosomal dominant with variable penetrance -There is no evidence of clinical catecholamine excess at this time, historically plasma catecholamines and metanephrines have been normal, will repeat again this year -We will consider 123 I-MIBG scintigraphy or an FDG-PET in the future, and this may be done every ~ 5 years - CMP and CBC normal on today's labs -Repeat MRI next year    2. Multinodular goiter:   -No local neck symptoms -Patient is clinically euthyroid -She is status post benign FNA of the right 4 cm nodule 2022 -We will proceed with thyroid ultrasound to confirm stability this year -TFTs are normal on today's labs  3. Cholelithiasis  -Patient is symptomatic -I had referred her to general surgery 04/2022 but she did not make that appointment.  Opted to postpone this as long as she is asymptomatic -LFTs are normal    F/U in 1 yr    Signed electronically by: Lyndle Herrlich, MD  [facility]  [facility] [address] [place], [place] [address] Phone: [phone] FAX: [phone]      CC: Elray Mcgregor [address] [place] [address] Phone: [phone]  Fax: [phone]   Return to Endocrinology clinic as below: Future Appointments  Date Time Provider Department Center  06/06/2023 10:30 AM Taylor Pearson, Konrad Dolores, MD [id number] None

## 2023-08-29 ENCOUNTER — Ambulatory Visit: Payer: Commercial Managed Care - PPO | Admitting: Internal Medicine

## 2024-06-05 ENCOUNTER — Ambulatory Visit (INDEPENDENT_AMBULATORY_CARE_PROVIDER_SITE_OTHER): Payer: Commercial Managed Care - PPO | Admitting: Internal Medicine

## 2024-06-05 VITALS — BP 110/68 | HR 72 | Ht 68.0 in | Wt 166.0 lb

## 2024-06-05 DIAGNOSIS — E042 Nontoxic multinodular goiter: Secondary | ICD-10-CM | POA: Diagnosis not present

## 2024-06-05 DIAGNOSIS — D447 Neoplasm of uncertain behavior of aortic body and other paraganglia: Secondary | ICD-10-CM | POA: Diagnosis not present

## 2024-06-05 NOTE — Progress Notes (Unsigned)
 Name: Taylor Pearson  MRN/ DOB: [id number], 30-Jan-1981    Age/ Sex: 43 y.o., female     PCP: Marinda Rocky CROME, DO   Reason for Endocrinology Evaluation: [facility] mutation     Initial Endocrinology Clinic Visit: 01/23/2019    PATIENT IDENTIFIER: Taylor Pearson is a 43 y.o., female with unremarkable past medical history. She has followed with [facility] since 01/23/2019 for consultative assistance with management of her SDHC mutation.  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   No evidence of paraganglioma on MRI imaging 03/2021 So far biochemical testing has been negative  Kids 5 and 84 yrs old  [place] maternal uncle with paraganglioma     THYROID  HISTORY: She had an incidental finding of right thyroid  nodule on MRI during evaluation for paragangliomas in May 2022.  Thyroid  ultrasound revealed a 4 cm right thyroid  nodule meeting FNA criteria.  She is status post benign FNA of the nodule on 04/08/2021   SUBJECTIVE:    Today (06/05/2024):  Taylor Pearson is here for SDHC mutation.   She has not had thyroid  ultrasound when ordered last year     Headaches: perimenopausal once a month HTN: no Palpitations: no Anxiety: no  Sweaty, Clammy sensations:  no  Abdominal Pain: no Nausea: nausea  No local neck swelling  Denies tremors      HISTORY:  Past Medical History:  Past Medical History:  Diagnosis Date   Family history of breast cancer    Family history of familial paraganglioma    Family history of lung cancer    Family history of prostate cancer    Past Surgical History:  Past Surgical History:  Procedure Laterality Date   TONSILLECTOMY     Social History:  reports that she has never smoked. She has never used smokeless tobacco. She reports that she does not currently use alcohol. She reports that she does not use drugs. Family  History:  Family History  Problem Relation Age of Onset   Other Mother        Positive for SDHC mutation    Breast cancer Maternal Aunt 60       Genetic testing negative- [facility]   Lung cancer Paternal Uncle    Prostate cancer Maternal Grandfather 84       also had bile duct or lung cancer?   Bladder Cancer Maternal Grandfather    Other Paternal Grandmother        might have had breast cancer >50   Breast cancer Maternal Aunt 9       Genetic testing negative- [facility]   Other Maternal Aunt        SDHC+   Other Maternal Aunt        SDHC+   Other Maternal Aunt        tumor removed from neck   Other Cousin        tumors removed from abdomen   Other Other 61       carotid paraganglioma, [facility]     HOME MEDICATIONS: Allergies as of 06/05/2024       Reactions   Amoxicillin-pot Clavulanate Other (See Comments), Rash   Rash  Unknown        Medication List        Accurate as of June 05, 2024  1:08 PM. If you have any questions, ask your nurse or doctor.  multivitamin capsule Take by mouth.          OBJECTIVE:   PHYSICAL EXAM: VS: BP 110/68 (BP Location: Left Arm, Patient Position: Sitting, Cuff Size: Normal)   Pulse 72   Ht 5' 8 (1.727 m)   Wt 166 lb (75.3 kg)   SpO2 98%   BMI 25.24 kg/m    EXAM: General: Pt appears well and is in NAD  Neck: General: Supple without adenopathy. Thyroid : Right thyroid  asymmetry   Lungs: Clear with good BS bilat   Heart: Auscultation: RRR.  Abdomen: soft, nontender  Extremities:  BL LE: No pretibial edema   Mental Status: Judgment, insight: Intact Orientation: Oriented to time, place, and person Mood and affect: No depression, anxiety, or agitation     DATA REVIEWED:   Latest Reference Range & Units 06/05/24 13:40  WBC 3.8 - 10.8 Thousand/uL 8.0  RBC 3.80 - 5.10 Million/uL 4.89  Hemoglobin 11.7 - 15.5 g/dL 85.8  HCT 64.9 - 54.9 % 43.3  MCV 80.0 - 100.0 fL 88.5  MCH 27.0 - 33.0 pg 28.8  MCHC 32.0 - 36.0  g/dL 67.3  RDW 88.9 - 84.9 % 12.3  Platelets 140 - 400 Thousand/uL 283  MPV 7.5 - 12.5 fL 10.0    Latest Reference Range & Units 06/05/24 13:40  TSH mIU/L 1.45  T4,Free(Direct) 0.8 - 1.8 ng/dL 1.7    MRI chest/abdomen/pelvis 03/15/2021 COMBINED FINDINGS FOR BOTH MR CHEST, ABDOMEN AND PELVIS   Cardiovascular: Normal heart size, no pericardial effusion. Normal caliber of the thoracic aorta.   Mediastinum/Nodes: RIGHT thyroid  lesion better seen on MRI of the neck of the same date. No adenopathy in the chest. No perispinal mass lesion. Grossly normal esophagus.   Lungs/Pleura: Lungs not well assessed on MRI. No gross consolidation, effusion or visible lesion.   Musculoskeletal: No chest wall mass. No acute musculoskeletal process or suspicious bone finding about the bony thorax.   Hepatobiliary: No focal, suspicious hepatic lesion. Cholelithiasis with 1 cm gallstone in the gallbladder neck. No biliary duct dilation. No pericholecystic stranding.   Pancreas: Normal intrinsic T1 signal in the pancreas. No sign of peripancreatic inflammation or adjacent lesion.   Spleen:  Normal size and contour without focal lesion.   Adrenals/Urinary Tract:  Normal adrenal glands.   Symmetric renal enhancement. No hydronephrosis. No perinephric stranding.   Stomach/Bowel: No acute gastrointestinal process. Study not performed for bowel evaluation.   Vascular/Lymphatic: Vascular structures without dilation. No adenopathy in the abdomen or in the pelvis.   Reproductive: IUD in situ. Uterus and adnexa otherwise unremarkable.   Other:  None   Musculoskeletal: No suspicious bone lesions identified.   IMPRESSION: 1. No suspicious lesion in the chest, abdomen or pelvis. No solid organ lesion or adrenal abnormality. 2. Thyroid  lesion better seen on MRI of the neck of the same date. Ultrasound follow-up suggested for further evaluation. 3. Cholelithiasis with 1 cm gallstone in the gallbladder  neck. No biliary duct dilation.     Thyroid  Ultrasound 04/25/2022  Parenchymal Echotexture: Mildly heterogenous   Isthmus: 0.3 cm   Right lobe: 0.4 cm x 2.5 cm x 3.7 cm   Left lobe: 6.6 cm x 1.4 cm x 1.5 cm   _________________________________________________________   Estimated total number of nodules >/= 1 cm: 1   Number of spongiform nodules >/=  2 cm not described below (TR1): 0   Number of mixed cystic and solid nodules >/= 1.5 cm not described below (TR2): 0   _________________________________________________________  Nodule within the right thyroid , previously biopsied, 4.4 cm, previously 4.3 cm. Assuming benign result, no further specific follow-up would be indicated.   Pseudo nodule on the left, 6 mm. Nodule does not meet criteria for surveillance.   No adenopathy   Recommendations follow those established by the new ACR TI-RADS criteria (J Am Coll Radiol 2017;14:587-595).   IMPRESSION: Heterogeneous thyroid  suggesting medical thyroid  disease.   Biopsy previously performed of the right thyroid  nodule, as above.    FNA right mid nodule 04/08/2021 Clinical History: Right mid 4.0cm; Other 2 dimensions: 3.1 x 1.9cm,  Solid / almost completely solid, Isoechoic, TI-RADS total points 3  Specimen Submitted:  A. THYROID , RMP, FINE NEEDLE ASPIRATION:    FINAL MICROSCOPIC DIAGNOSIS:  - Consistent with benign follicular nodule (Bethesda category II)   ASSESSMENT / PLAN / RECOMMENDATIONS:   Succinate Dehydrogenase C (SDHC) Mutation:   - Head and neck paragangliomas are the most common tumor type seen in carriers of SDHC mutations. - SDHC mutations is an autosomal dominant with variable penetrance -There is no clinical evidence of paraganglioma or pheochromocytoma -Will repeat catecholamines this year as well as CBC -No evidence of paragangliomas on imaging, will repeat MRI next year -We will consider 123 I-MIBG scintigraphy or an FDG-PET in the future  depending on clinical scenario    2. Multinodular goiter:  -Patient is clinically and biochemically euthyroid -No local neck symptoms -Patient is clinically euthyroid -She is status post benign FNA of the right 4 cm nodule 2022 -We will proceed with thyroid  ultrasound to confirm stability this year    F/U in 1 yr    Signed electronically by: Stefano Redgie Butts, MD  [facility]  [facility] [address] [place], [place] [address] Phone: [phone] FAX: [phone]      CC: Marinda Rocky CROME, DO No address on file Phone: None  Fax: None   Return to Endocrinology clinic as below: No future appointments.

## 2024-06-06 ENCOUNTER — Ambulatory Visit: Payer: Self-pay | Admitting: Internal Medicine

## 2024-06-10 LAB — CBC
HCT: 43.3 % (ref 35.0–45.0)
Hemoglobin: 14.1 g/dL (ref 11.7–15.5)
MCH: 28.8 pg (ref 27.0–33.0)
MCHC: 32.6 g/dL (ref 32.0–36.0)
MCV: 88.5 fL (ref 80.0–100.0)
MPV: 10 fL (ref 7.5–12.5)
Platelets: 283 Thousand/uL (ref 140–400)
RBC: 4.89 Million/uL (ref 3.80–5.10)
RDW: 12.3 % (ref 11.0–15.0)
WBC: 8 Thousand/uL (ref 3.8–10.8)

## 2024-06-10 LAB — CATECHOLAMINES, FRACTIONATED, PLASMA
Dopamine: 17 pg/mL
Epinephrine: <10 pg/mL
Norepinephrine: 569 pg/mL
Total Catecholamines: <596 pg/mL

## 2024-06-10 LAB — T4, FREE: Free T4: 1.7 ng/dL (ref 0.8–1.8)

## 2024-06-10 LAB — TSH: TSH: 1.45 m[IU]/L

## 2024-06-18 ENCOUNTER — Ambulatory Visit (HOSPITAL_BASED_OUTPATIENT_CLINIC_OR_DEPARTMENT_OTHER)
Admission: RE | Admit: 2024-06-18 | Discharge: 2024-06-18 | Disposition: A | Discharge status: Home / Self Care | Source: Ambulatory Visit | Attending: Internal Medicine | Admitting: Internal Medicine

## 2024-06-18 DIAGNOSIS — E042 Nontoxic multinodular goiter: Secondary | ICD-10-CM | POA: Insufficient documentation

## 2025-06-06 ENCOUNTER — Ambulatory Visit: Admitting: Internal Medicine
